# Patient Record
Sex: Female | Born: 1955 | Hispanic: No | Marital: Married | Smoking: Former smoker
Health system: Southern US, Community
[De-identification: ages and names within clinical notes are randomized; demographics above are authoritative.]

## PROBLEM LIST (undated history)

## (undated) DIAGNOSIS — U071 COVID-19: Secondary | ICD-10-CM

## (undated) DIAGNOSIS — E119 Type 2 diabetes mellitus without complications: Secondary | ICD-10-CM

## (undated) DIAGNOSIS — I739 Peripheral vascular disease, unspecified: Secondary | ICD-10-CM

## (undated) DIAGNOSIS — I10 Essential (primary) hypertension: Secondary | ICD-10-CM

## (undated) DIAGNOSIS — F419 Anxiety disorder, unspecified: Secondary | ICD-10-CM

## (undated) HISTORY — DX: COVID-19: U07.1

## (undated) HISTORY — PX: CHOLECYSTECTOMY: SHX55

---

## 2008-12-15 ENCOUNTER — Encounter: Payer: Self-pay | Admitting: Gastroenterology

## 2009-01-05 ENCOUNTER — Encounter: Payer: Self-pay | Admitting: Gastroenterology

## 2009-01-10 ENCOUNTER — Encounter: Payer: Self-pay | Admitting: Gastroenterology

## 2009-01-17 ENCOUNTER — Encounter: Payer: Self-pay | Admitting: Gastroenterology

## 2009-01-26 ENCOUNTER — Encounter: Payer: Self-pay | Admitting: Gastroenterology

## 2009-02-02 ENCOUNTER — Encounter: Payer: Self-pay | Admitting: Gastroenterology

## 2009-02-03 ENCOUNTER — Encounter: Payer: Self-pay | Admitting: Gastroenterology

## 2009-02-07 ENCOUNTER — Encounter: Payer: Self-pay | Admitting: Gastroenterology

## 2009-02-14 ENCOUNTER — Encounter: Payer: Self-pay | Admitting: Gastroenterology

## 2009-02-15 ENCOUNTER — Encounter: Payer: Self-pay | Admitting: Gastroenterology

## 2009-02-15 DIAGNOSIS — R932 Abnormal findings on diagnostic imaging of liver and biliary tract: Secondary | ICD-10-CM | POA: Insufficient documentation

## 2009-03-09 ENCOUNTER — Ambulatory Visit (HOSPITAL_COMMUNITY): Admission: RE | Admit: 2009-03-09 | Discharge: 2009-03-09 | Payer: Self-pay | Admitting: Gastroenterology

## 2009-03-09 ENCOUNTER — Ambulatory Visit: Payer: Self-pay | Admitting: Gastroenterology

## 2018-07-29 DIAGNOSIS — I10 Essential (primary) hypertension: Secondary | ICD-10-CM

## 2018-07-29 DIAGNOSIS — I739 Peripheral vascular disease, unspecified: Secondary | ICD-10-CM

## 2018-07-29 DIAGNOSIS — I160 Hypertensive urgency: Secondary | ICD-10-CM

## 2018-07-29 DIAGNOSIS — R079 Chest pain, unspecified: Secondary | ICD-10-CM

## 2018-07-29 DIAGNOSIS — E785 Hyperlipidemia, unspecified: Secondary | ICD-10-CM

## 2018-07-29 DIAGNOSIS — E119 Type 2 diabetes mellitus without complications: Secondary | ICD-10-CM

## 2018-07-30 DIAGNOSIS — R079 Chest pain, unspecified: Secondary | ICD-10-CM

## 2021-04-17 ENCOUNTER — Telehealth (HOSPITAL_COMMUNITY): Payer: Self-pay | Admitting: Radiology

## 2021-04-17 ENCOUNTER — Other Ambulatory Visit (HOSPITAL_COMMUNITY): Payer: Self-pay | Admitting: Interventional Radiology

## 2021-04-17 DIAGNOSIS — I739 Peripheral vascular disease, unspecified: Secondary | ICD-10-CM

## 2021-04-17 NOTE — Telephone Encounter (Signed)
Tried to call pt, no answer and no VM. JM

## 2021-04-27 ENCOUNTER — Other Ambulatory Visit: Payer: Self-pay | Admitting: Student

## 2021-04-30 ENCOUNTER — Other Ambulatory Visit (HOSPITAL_COMMUNITY): Payer: Self-pay | Admitting: Interventional Radiology

## 2021-04-30 ENCOUNTER — Encounter (HOSPITAL_COMMUNITY): Payer: Self-pay

## 2021-04-30 ENCOUNTER — Other Ambulatory Visit: Payer: Self-pay

## 2021-04-30 ENCOUNTER — Ambulatory Visit (HOSPITAL_BASED_OUTPATIENT_CLINIC_OR_DEPARTMENT_OTHER)
Admission: RE | Admit: 2021-04-30 | Discharge: 2021-04-30 | Disposition: A | Discharge status: Home / Self Care | Payer: Self-pay | Source: Ambulatory Visit | Attending: Interventional Radiology | Admitting: Interventional Radiology

## 2021-04-30 ENCOUNTER — Ambulatory Visit (HOSPITAL_COMMUNITY)
Admission: RE | Admit: 2021-04-30 | Discharge: 2021-04-30 | Disposition: A | Discharge status: Home / Self Care | Payer: Medicaid Other | Source: Ambulatory Visit | Attending: Interventional Radiology | Admitting: Interventional Radiology

## 2021-04-30 ENCOUNTER — Other Ambulatory Visit (HOSPITAL_COMMUNITY): Payer: Self-pay | Admitting: Physician Assistant

## 2021-04-30 DIAGNOSIS — I70211 Atherosclerosis of native arteries of extremities with intermittent claudication, right leg: Secondary | ICD-10-CM | POA: Insufficient documentation

## 2021-04-30 DIAGNOSIS — I10 Essential (primary) hypertension: Secondary | ICD-10-CM | POA: Insufficient documentation

## 2021-04-30 DIAGNOSIS — Z87891 Personal history of nicotine dependence: Secondary | ICD-10-CM | POA: Insufficient documentation

## 2021-04-30 DIAGNOSIS — E1151 Type 2 diabetes mellitus with diabetic peripheral angiopathy without gangrene: Secondary | ICD-10-CM | POA: Insufficient documentation

## 2021-04-30 DIAGNOSIS — I739 Peripheral vascular disease, unspecified: Secondary | ICD-10-CM

## 2021-04-30 DIAGNOSIS — E785 Hyperlipidemia, unspecified: Secondary | ICD-10-CM | POA: Insufficient documentation

## 2021-04-30 HISTORY — PX: IR ANGIOGRAM EXTREMITY RIGHT: IMG652

## 2021-04-30 HISTORY — PX: IR US GUIDE VASC ACCESS RIGHT: IMG2390

## 2021-04-30 HISTORY — PX: IR ILIAC ART STENT INC PTA EA ADD IPSILAT MOD SED: IMG2308

## 2021-04-30 LAB — BASIC METABOLIC PANEL
Anion gap: 10 (ref 5–15)
BUN: 11 mg/dL (ref 8–23)
CO2: 25 mmol/L (ref 22–32)
Calcium: 10.2 mg/dL (ref 8.9–10.3)
Chloride: 102 mmol/L (ref 98–111)
Creatinine, Ser: 0.49 mg/dL (ref 0.44–1.00)
GFR, Estimated: >60 mL/min (ref >60)
Glucose, Bld: 155 mg/dL — ABNORMAL HIGH (ref 70–99)
Potassium: 5.1 mmol/L (ref 3.5–5.1)
Sodium: 137 mmol/L (ref 135–145)

## 2021-04-30 LAB — CBC
HCT: 38.7 % (ref 36.0–46.0)
Hemoglobin: 12 g/dL (ref 12.0–15.0)
MCH: 27.5 pg (ref 26.0–34.0)
MCHC: 31 g/dL (ref 30.0–36.0)
MCV: 88.6 fL (ref 80.0–100.0)
Platelets: 287 10*3/uL (ref 150–400)
RBC: 4.37 MIL/uL (ref 3.87–5.11)
RDW: 17 % — ABNORMAL HIGH (ref 11.5–15.5)
WBC: 7.9 10*3/uL (ref 4.0–10.5)
nRBC: 0 % (ref 0.0–0.2)

## 2021-04-30 LAB — PROTIME-INR
INR: 1 (ref 0.8–1.2)
Prothrombin Time: 13.1 seconds (ref 11.4–15.2)

## 2021-04-30 LAB — GLUCOSE, CAPILLARY: Glucose-Capillary: 143 mg/dL — ABNORMAL HIGH (ref 70–99)

## 2021-04-30 MED ORDER — ASPIRIN 325 MG PO TABS: 650.0000 mg | ORAL_TABLET | Freq: Once | ORAL | Status: AC

## 2021-04-30 MED ORDER — PROTAMINE SULFATE 10 MG/ML IV SOLN
50.0000 mg | Freq: Once | INTRAVENOUS | Status: AC
  Administered 2021-04-30: 40 mg via INTRAVENOUS
  Filled 2021-04-30: qty 5

## 2021-04-30 MED ORDER — FENTANYL CITRATE (PF) 100 MCG/2ML IJ SOLN
INTRAMUSCULAR | Status: AC
  Filled 2021-04-30: qty 2

## 2021-04-30 MED ORDER — MIDAZOLAM HCL 2 MG/2ML IJ SOLN
INTRAMUSCULAR | Status: AC
  Filled 2021-04-30: qty 2

## 2021-04-30 MED ORDER — CLOPIDOGREL BISULFATE 75 MG PO TABS: 300.0000 mg | ORAL_TABLET | Freq: Once | ORAL | Status: AC

## 2021-04-30 MED ORDER — SODIUM CHLORIDE 0.9 % IV SOLN: INTRAVENOUS | Status: DC

## 2021-04-30 MED ORDER — MIDAZOLAM HCL 2 MG/2ML IJ SOLN
INTRAMUSCULAR | Status: AC | PRN
  Administered 2021-04-30 (×3): 1 mg via INTRAVENOUS
  Administered 2021-04-30 (×2): 0.5 mg via INTRAVENOUS

## 2021-04-30 MED ORDER — CLOPIDOGREL BISULFATE 300 MG PO TABS
ORAL_TABLET | ORAL | Status: AC
  Administered 2021-04-30: 300 mg via ORAL
  Filled 2021-04-30: qty 1

## 2021-04-30 MED ORDER — LIDOCAINE HCL (PF) 1 % IJ SOLN
INTRAMUSCULAR | Status: AC
  Filled 2021-04-30: qty 30

## 2021-04-30 MED ORDER — HEPARIN SODIUM (PORCINE) 1000 UNIT/ML IJ SOLN
INTRAMUSCULAR | Status: AC | PRN
  Administered 2021-04-30: 2000 [IU] via INTRAVENOUS
  Administered 2021-04-30: 6000 [IU] via INTRAVENOUS

## 2021-04-30 MED ORDER — FENTANYL CITRATE (PF) 100 MCG/2ML IJ SOLN
INTRAMUSCULAR | Status: AC | PRN
  Administered 2021-04-30: 25 ug via INTRAVENOUS
  Administered 2021-04-30 (×2): 50 ug via INTRAVENOUS
  Administered 2021-04-30: 25 ug via INTRAVENOUS

## 2021-04-30 MED ORDER — IODIXANOL 320 MG/ML IV SOLN
100.0000 mL | Freq: Once | INTRAVENOUS | Status: AC | PRN
  Administered 2021-04-30: 25 mL via INTRA_ARTERIAL

## 2021-04-30 MED ORDER — CLOPIDOGREL BISULFATE 75 MG PO TABS: 75.0000 mg | ORAL_TABLET | Freq: Every day | ORAL | 2 refills | Status: AC

## 2021-04-30 MED ORDER — HEPARIN SODIUM (PORCINE) 1000 UNIT/ML IJ SOLN
INTRAMUSCULAR | Status: AC
  Filled 2021-04-30: qty 1

## 2021-04-30 MED ORDER — ASPIRIN 325 MG PO TABS
ORAL_TABLET | ORAL | Status: AC
  Administered 2021-04-30: 650 mg via ORAL
  Filled 2021-04-30: qty 2

## 2021-04-30 MED ORDER — IODIXANOL 320 MG/ML IV SOLN
100.0000 mL | Freq: Once | INTRAVENOUS | Status: AC | PRN
  Administered 2021-04-30: 30 mL via INTRA_ARTERIAL

## 2021-04-30 MED ORDER — LIDOCAINE HCL (PF) 1 % IJ SOLN
INTRAMUSCULAR | Status: AC | PRN
  Administered 2021-04-30: 10 mL

## 2021-04-30 NOTE — Procedures (Signed)
Interventional Radiology Procedure Note  Procedure:   US guided access right CFA.  US guided access right dorsalis pedis Right lower extremity angiogram.  Pressure measurement  Balloon mounted stenting of right CIA origin stenosis.  Failed retrograde attempt of SFA occlusion from DP Angioseal closure of the right CFA.  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  Pressures:   Aorta: 162/72 (106) Initial CFA 118/69 (88) Final CFA 158/64 (93) .  Complications: None  Recommendations:  - right hip straight x 4 hours - advance diet - dual anti-platelets x 90 days, then only 81mg  ASA after 90 days - Do not submerge for 7 days - Follow up with Dr. in ~4 weeks in clinic  Signed,  Loreta Ave. Yvone Neu, DO

## 2021-04-30 NOTE — Progress Notes (Signed)
Right lower extremity arterial duplex completed. Refer to "CV Proc" under chart review to view preliminary results.  Attempted to call preliminary results to Dr. Loreta Ave at [phone].  04/30/2021 3:16 PM Eula Fried., MHA, RVT, RDCS, RDMS

## 2021-04-30 NOTE — Progress Notes (Signed)
Transported patient back to Short Stay P17 after procedure. Patient's pulses unable to obtain at right posterior tibia and right pedal pulse. Both pulses were doppler obtained prior to procedure. Dr. Wagner aware and plan is to warm leg and re-evaluate at 1315PM. Bedside handoff and update provided to Molly N. RN and Charge RN Page.   

## 2021-04-30 NOTE — H&P (Addendum)
Chief Complaint: Patient was seen in consultation today for peripheral vascular disease  Referring Physician:  Marlena Clipper FNP-C  Supervising Physician: Corrie Mckusick  Patient Status: [facility] - Out-pt  History of Present Illness: Ashley [name] is a 65 y.o. female with history of HLD, HTN, DM, former smoker presents with RLE pain with ambulation due to claudication of the right lower extremity. Symptoms have been intermittently present for months to years but worse in the past 2-3 months. She is known to IR from recent consultation with Dr. Earleen Newport 04/02/21 at which time non-invasive lower extremity exam demonstrates moderate severity resting ABI on the right, which would likely decrease after exercise exam. 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  Discussion was held regarding intervention for endovascular intervention and she elects to proceed.  Ashley [name] presents to [facility] today in her usual state of health.  She reports ongoing pain and cramping in her RLE.  Calf is tender to touch.  No changes in her symptoms since consultation with Dr. Earleen Newport in May.  No swelling, erythema, or new wounds.    Dr. Earleen Newport EDIT: Patient was referred to our service at [facility].  Consult performed 04/02/2021.   Note from that visit: CHIEF COMPLAINT: Painful walking  HISTORY OF PRESENT ILLNESS: Ashley [name] is a 65 year old female presenting as a scheduled consultation to vascular and Interventional Radiology clinic today, kindly referred by her primary care physician, Marlena Clipper FNP-C, for evaluation difficulty walking and known vascular risk factors.  Ashley [name] tells me that her symptoms of pain in the right lower extremity have been worsening now for the last 2-3 months, though have have been episodically present for at least many months to years. She tells me that the pain is mostly in the calf and behind the knee  and is present at short distance of walking, particularly uphill.  The symptoms have significantly altered her ability to perform her daily activities, including gross hree shock upping and cleaning the house.  I cannot elicit any resting pain or night pain, though she says she has occasional cramps in both legs.  She denies any symptoms on the left.  She has never had a wound.  She denies any prior history of heart attack stroke. She denies resting chest pain or chest pain with activity.  Cardiovascular risk factors include diabetes 2, hypertension, hyperlipidemia, history of smoking having quit 5 years ago though 1 pack per day since her teenage years.   Past Surgical history: Gallbladder/cholecystectomy, left oophorectomy  Medications: She reports hypertensive medication, baby aspirin daily, cholesterol medication, and medication for diabetes though does not recall the name  Allergies: No known drug allergies. She has an intolerance to codeine  Family history: She reports multiple family members with lower extremity arterial problems as well as heart attack  Social history: She is a long-time former smoker having quit 5 years ago. One pack per day since her teenage years. She remains very active at her home cleaning and cooking and doing the shopping for the household.  CORRELATIVE IMAGING: Noninvasive study performed 03/26/2021  Right ABI: 0.52  Left ABI: 0.50  Ashley [name] is a 65 year old female presenting with short distance lifestyle limiting claudication of the right extremity, compatible with Rutherford 3 class symptoms.  Non-invasive lower extremity exam demonstrates moderate severity resting ABI on the right, which would likely decrease after exercise exam. 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  I had a lengthy discussion with Ashley [name] regarding anatomy, pathology/pathophysiology,  natural history, and prognosis of PAD/CLI. Informed consent regarding treatment strategies was performed which would possibly include medical management, walking program, surgical strategy, and/or endovascular options, with risk/benefit discussion. The indications for treatment supported by updated guidelines (1, 2) were discussed.  Her main concerns is the fact that she is "miserable" with the symptoms and does not wish to live with the symptoms any further given her limited abilities with claudication.  After our discussion, she has elected to proceed with endovascular options. Regarding endovascular options, specific risks discussed include: bleeding, infection, contrast reaction, renal injury/nephropathy, arterial injury/dissection, need for additional procedure/surgery, worsening symptoms/tissue including limb loss, cardiopulmonary collapse, death.  Regarding medical management, maximal medical therapy for reduction of risk factors is indicated as recommended by updated AHA guidelines. (1.) This includes anti-platelet medication, tight blood glucose control to a HbA1c < 7, tight blood pressure control, maximum-dose HMG-CoA reductase inhibitor, and smoking cessation. She has already been successful with smoking cessation, on which I congratulated her.  Annual flu vaccination is also recommended, with Class 1 recommendation1.    History reviewed. No pertinent past medical history.  Allergies: Codeine  Medications: Prior to Admission medications   Medication Sig Start Date End Date Taking? Authorizing Provider  acetaminophen (TYLENOL) 500 MG tablet Take 1,000 mg by mouth every 6 (six) hours as needed for moderate pain or headache.   Yes [provider]  ALPRAZolam (XANAX) 0.5 MG tablet Take 0.25-0.5 mg by mouth daily. 03/26/21  Yes [provider]  aspirin EC 81 MG tablet Take 81 mg by mouth 2 (two) times daily. Swallow whole.   Yes [provider]   atorvastatin (LIPITOR) 80 MG tablet Take 80 mg by mouth daily.   Yes [provider]  cyclobenzaprine (FLEXERIL) 5 MG tablet Take 5 mg by mouth at bedtime. 01/25/21  Yes [provider]  glipiZIDE (GLUCOTROL) 5 MG tablet Take 10 mg by mouth in the morning and at bedtime. 01/25/21  Yes [provider]  HUMULIN N 100 UNIT/ML injection Inject 60 Units into the skin daily. 01/25/21  Yes [provider]  hydrochlorothiazide (HYDRODIURIL) 25 MG tablet Take 12.5 mg by mouth daily. 01/25/21  Yes [provider]  losartan (COZAAR) 100 MG tablet Take 100 mg by mouth daily. 01/25/21  Yes [provider]  metFORMIN (GLUCOPHAGE) 1000 MG tablet Take 1,000-1,500 mg by mouth See admin instructions. Take 1500 mg in the morning and 1000 mg at night 01/25/21  Yes [provider]  metoprolol tartrate (LOPRESSOR) 25 MG tablet Take 37.5 mg by mouth 2 (two) times daily. 01/25/21  Yes [provider]  pantoprazole (PROTONIX) 40 MG tablet Take 30 mg by mouth daily. 04/10/21  Yes [provider]  Pediatric Multivitamins-Iron (FLINTSTONES COMPLETE PO) Take 2 tablets by mouth daily.   Yes [provider]  potassium chloride (MICRO-K) 10 MEQ CR capsule Take 10 mEq by mouth daily. 01/25/21  Yes [provider]  Tetrahydrozoline HCl (VISINE OP) Place 1 drop into both eyes daily as needed (irritation).   Yes [provider]  traMADol (ULTRAM) 50 MG tablet Take 50 mg by mouth 3 (three) times daily as needed for pain. 03/26/21  Yes [provider]  TRULICITY 1.5 CL/2.7NT SOPN Inject 1.5 mg into the skin every Wednesday. 02/22/21  Yes [provider]  acyclovir (ZOVIRAX) 400 MG tablet Take 400 mg  by mouth daily as needed (fever blisters).    [provider]     History reviewed. No pertinent family history.  Social History   Socioeconomic History  . Marital status: Married    Spouse name: Not on file  . Number  of children: Not on file  . Years of education: Not on file  . Highest education level: Not on file  Occupational History  . Not on file  Tobacco Use  . Smoking status: Not on file  . Smokeless tobacco: Not on file  Substance and Sexual Activity  . Alcohol use: Not on file  . Drug use: Not on file  . Sexual activity: Not on file  Other Topics Concern  . Not on file  Social History Narrative  . Not on file   Social Determinants of Health   Financial Resource Strain: Not on file  Food Insecurity: Not on file  Transportation Needs: Not on file  Physical Activity: Not on file  Stress: Not on file  Social Connections: Not on file     Review of Systems: A 12 point ROS discussed and pertinent positives are indicated in the HPI above.  All other systems are negative.  Review of Systems  Constitutional: Negative for fatigue and fever.  Respiratory: Negative for cough and shortness of breath.   Cardiovascular: Negative for chest pain.  Gastrointestinal: Negative for abdominal pain, diarrhea, nausea and vomiting.  Genitourinary: Negative for dysuria.  Musculoskeletal: Positive for myalgias (calf pain). Negative for back pain.       Aching and cramping in RLE  Skin: Negative for color change, rash and wound.  Psychiatric/Behavioral: Negative for behavioral problems and confusion.    Vital Signs: BP (!) 154/76   Pulse 67   Temp 97.7 F (36.5 C) (Oral)   Ht _0  (1.626 m)   Wt 175 lb (79.4 kg)   SpO2 98%   BMI 30.04 kg/m   Physical Exam Vitals and nursing note reviewed.  Constitutional:      General: She is not in acute distress.    Appearance: Normal appearance. She is not ill-appearing.  HENT:     Mouth/Throat:     Mouth: Mucous membranes are moist.     Pharynx: Oropharynx is clear.  Cardiovascular:     Rate and Rhythm: Normal rate and regular rhythm.  Pulmonary:     Effort: Pulmonary effort is normal.     Breath sounds: Normal breath sounds.  Abdominal:      General: Abdomen is flat.     Palpations: Abdomen is soft.  Musculoskeletal:        General: Tenderness (tenderness to palpation in the right calf today, no edema, no erythema) present. No swelling, deformity or signs of injury.     Right lower leg: No edema.     Left lower leg: No edema.  Skin:    General: Skin is warm and dry.  Neurological:     General: No focal deficit present.     Mental Status: She is alert and oriented to person, place, and time. Mental status is at baseline.  Psychiatric:        Mood and Affect: Mood normal.        Behavior: Behavior normal.        Thought Content: Thought content normal.        Judgment: Judgment normal.      MD Evaluation Airway: WNL Heart: WNL Abdomen: WNL Chest/ Lungs: WNL ASA  Classification: 3 Mallampati/Airway  Score: Two   Imaging: No results found.  Labs:  CBC: Recent Labs    04/30/21 0714  WBC 7.9  HGB 12.0  HCT 38.7  PLT 287    COAGS: Recent Labs    04/30/21 0714  INR 1.0    BMP: Recent Labs    04/30/21 0714  NA 137  K 5.1  CL 102  CO2 25  GLUCOSE 155*  BUN 11  CALCIUM 10.2  CREATININE 0.49  GFRNONAA >60    LIVER FUNCTION TESTS: No results for input(s): BILITOT, AST, ALT, ALKPHOS, PROT, ALBUMIN in the last 8760 hours.  TUMOR MARKERS: No results for input(s): AFPTM, CEA, CA199, CHROMGRNA in the last 8760 hours.  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  IR consulted for angiogram with possible intervention at the request of Marlena Clipper, FNP-C. Case reviewed by Dr. Earleen Newport who has met with the patient in consultation, discussed the procedure at length, and approves patient for procedure.  Patient presents today in their usual state of health.  She has been NPO and is not currently on blood thinners. She did take 81 mg  aspiring this AM.  SCr 0.49  Risks and benefits were discussed with the patient including, but not limited to bleeding, infection, vascular injury or contrast induced renal failure.  This interventional procedure involves the use of X-rays and because of the nature of the planned procedure, it is possible that we will have prolonged use of X-ray fluoroscopy.  Potential radiation risks to you include (but are not limited to) the following: - A slightly elevated risk for cancer  several years later in life. This risk is typically less than 0.5% percent. This risk is low in comparison to the normal incidence of human cancer, which is 33% for women and 50% for men according to the [employer]. - Radiation induced injury can include skin redness, resembling a rash, tissue breakdown / ulcers and hair loss (which can be temporary or permanent).   The likelihood of either of these occurring depends on the difficulty of the procedure and whether you are sensitive to radiation due to previous procedures, disease, or genetic conditions.   IF your procedure requires a prolonged use of radiation, you will be notified and given written instructions for further action.  It is your responsibility to monitor the irradiated area for the 2 weeks following the procedure and to notify your physician if you are concerned that you have suffered a radiation induced injury.    All of the patient's questions were answered, patient is agreeable to proceed.  Consent signed and in chart.  Thank you for this interesting consult.  I greatly enjoyed meeting Ashley [name] and look forward to participating in their care.  A copy of this report was sent to the requesting provider on this date.  Electronically Signed: Docia Barrier, PA 04/30/2021, 8:45 AM   I spent a total of    25 Minutes in face to face in clinical consultation, greater than 50% of which was counseling/coordinating care for RLE  claudication, peripheral vascular disease.

## 2021-04-30 NOTE — Discharge Instructions (Signed)
Femoral Site Care  This sheet gives you information about how to care for yourself after your procedure. Your health care provider may also give you more specific instructions. If you have problems or questions, contact your health care provider. What can I expect after the procedure? After the procedure, it is common to have:  Bruising that usually fades within 1-2 weeks.  Tenderness at the site. Follow these instructions at home: Wound care  Follow instructions from your health care provider about how to take care of your insertion site. Make sure you: ? Wash your hands with soap and water before you change your bandage (dressing). If soap and water are not available, use hand sanitizer. ? Change your dressing as told by your health care provider. ? Leave stitches (sutures), skin glue, or adhesive strips in place. These skin closures may need to stay in place for 2 weeks or longer. If adhesive strip edges start to loosen and curl up, you may trim the loose edges. Do not remove adhesive strips completely unless your health care provider tells you to do that.  Do not take baths, swim, or use a hot tub for one week  You may shower 24-48 hours after the procedure or as told by your health care provider. ? Gently wash the site with plain soap and water. ? Pat the area dry with a clean towel. ? Do not rub the site. This may cause bleeding.  Do not apply powder or lotion to the site. Keep the site clean and dry.  Check your femoral site every day for signs of infection. Check for: ? Redness, swelling, or pain. ? Fluid or blood. ? Warmth. ? Pus or a bad smell. Activity  For the first 2-3 days after your procedure, or as long as directed: ? Avoid climbing stairs as much as possible. ? Do not squat.  Do not lift anything that is heavier than 10 lb (4.5 kg), or the limit that you are told, until your health care provider says that it is safe.  Rest as directed. ? Avoid sitting for a  long time without moving. Get up to take short walks every 1-2 hours.  Do not drive for 24 hours if you were given a medicine to help you relax (sedative). General instructions  Take over-the-counter and prescription medicines only as told by your health care provider.  Keep all follow-up visits as told by your health care provider. This is important. Contact a health care provider if you have:  A fever or chills.  You have redness, swelling, or pain around your insertion site. Get help right away if:  The catheter insertion area swells very fast.  You pass out.  You suddenly start to sweat or your skin gets clammy.  The catheter insertion area is bleeding, and the bleeding does not stop when you hold steady pressure on the area.  The area near or just beyond the catheter insertion site becomes pale, cool, tingly, or numb. These symptoms may represent a serious problem that is an emergency. Do not wait to see if the symptoms will go away. Get medical help right away. Call your local emergency services (911 in the [place] Do not drive yourself to the hospital. Summary  After the procedure, it is common to have bruising that usually fades within 1-2 weeks.  Check your femoral site every day for signs of infection.  Do not lift anything that is heavier than 10 lb (4.5 kg), or the limit that  you are told, until your health care provider says that it is safe. This information is not intended to replace advice given to you by your health care provider. Make sure you discuss any questions you have with your health care provider. Document Revised: 07/14/2020 Document Reviewed: 07/14/2020 Elsevier Patient Education  2021 [employer].

## 2021-04-30 NOTE — Sedation Documentation (Signed)
Transported patient back to Short Stay P17 after procedure. Patient's pulses unable to obtain at right posterior tibia and right pedal pulse. Both pulses were doppler obtained prior to procedure. Dr. Loreta Ave aware and plan is to warm leg and re-evaluate at 1315PM. Bedside handoff and update provided to Christianne Dolin RN and Charge RN Page.

## 2021-04-30 NOTE — Progress Notes (Addendum)
Interventional Radiology Progress Note  65 yo female with PAD, right lower extremity rest pain.    Complex disease, with TASC D aorto-iliac disease.  Left CIA, EIA, and hypogastric artery CTO, with right CIA high grade stenosis, moderate right CFA disease, and long segment right SFA CTO.   Right CIA treated today from RCFA access, with 79mm x 71mm gore VBX.   Resolution of pressure gradient, from gradient of 44 (Aorta: 162/72, Initial CFA 118/69), to final gradient of 4 (CFA 158/64).   Failed attempt of crossing the right SFA CTO from retrograde right pedal approach.    Duplex performed post-op today. Images reviewed.  Duplex demonstrates patent right EIA, CFA, Popliteal artery, and tibial arteries.  Monophasic waveforms noted.  Some artifact at the CFA.   Overall, tibial waveform improved from preop doppler waveform.  Preop: 03/26/2021   Patient denies any pain.  Motor sensory intact, with extremity warm and perfused.  Symmetric to the left.   Plan for DC now with continued max medical therapy and dual anti-platelet therapy.    Office visit in ~4 weeks with Dr. Loreta Ave in clinic.   Signed,  Yvone Neu. Loreta Ave, DO

## 2021-06-12 ENCOUNTER — Ambulatory Visit: Payer: Medicaid Other | Admitting: Vascular Surgery

## 2021-06-12 ENCOUNTER — Other Ambulatory Visit: Payer: Self-pay

## 2021-06-12 ENCOUNTER — Ambulatory Visit (INDEPENDENT_AMBULATORY_CARE_PROVIDER_SITE_OTHER): Payer: Self-pay | Admitting: Vascular Surgery

## 2021-06-12 ENCOUNTER — Encounter: Payer: Self-pay | Admitting: Vascular Surgery

## 2021-06-12 VITALS — BP 172/92 | HR 84 | Temp 98.3°F | Resp 18 | Ht 64.0 in | Wt 177.7 lb

## 2021-06-12 DIAGNOSIS — I70221 Atherosclerosis of native arteries of extremities with rest pain, right leg: Secondary | ICD-10-CM

## 2021-06-12 NOTE — Progress Notes (Signed)
[facility] [place]  ASSESSMENT / PLAN: 65 y.o. female with ischemic rest pain of the right lower extremity.  She underwent treatment of right common iliac artery stenosis with Dr. Loreta Ave 04/30/21.  She unfortunately still has rest pain.  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  We will check a CTA to plan reconstruction.  I suspect treating her common femoral disease will be sufficient to treat her rest pain.  After CTA will call the patient to schedule an operating room time.  CHIEF COMPLAINT: Right leg pain  HISTORY OF PRESENT ILLNESS: Ashley [name] is a 65 y.o. female referred to clinic by Dr. Loreta Ave for evaluation of right lower extremity ischemic rest pain.  Patient reports she has extreme pain with ambulation.  Describes constant pain in her foot typical of ischemic rest pain.  She has no wounds about her foot stepped for the dorsalis pedis access site from Dr. Kenna Gilbert angiogram.  This appears to be healing.  Past medical history: DM2 Hypertension GERD Peripheral arterial disease  Past Surgical History:  Procedure Laterality Date   IR ANGIOGRAM EXTREMITY RIGHT  04/30/2021   IR ILIAC ART STENT INC PTA EA ADD IPSILAT MOD SED  04/30/2021   IR US GUIDE VASC ACCESS RIGHT  04/30/2021   IR US GUIDE VASC ACCESS RIGHT  04/30/2021    History reviewed. No pertinent family history.  Social History   Socioeconomic History   Marital status: Married    Spouse name: Not on file   Number of children: Not on file   Years of education: Not on file   Highest education level: Not on file  Occupational History   Not on file  Tobacco Use   Smoking status: Not on file   Smokeless tobacco: Not on file  Substance and Sexual Activity   Alcohol use: Not on file   Drug use: Not on file   Sexual activity: Not on file  Other Topics Concern   Not on file  Social History Narrative   Not on file   Social Determinants of Health    Financial Resource Strain: Not on file  Food Insecurity: Not on file  Transportation Needs: Not on file  Physical Activity: Not on file  Stress: Not on file  Social Connections: Not on file  Intimate Partner Violence: Not on file    Allergies  Allergen Reactions   Codeine     Upset stomach    Current Outpatient Medications  Medication Sig Dispense Refill   acetaminophen (TYLENOL) 500 MG tablet Take 1,000 mg by mouth every 6 (six) hours as needed for moderate pain or headache.     acyclovir (ZOVIRAX) 400 MG tablet Take 400 mg by mouth daily as needed (fever blisters).     ALPRAZolam (XANAX) 0.5 MG tablet Take 0.25-0.5 mg by mouth daily.     aspirin 325 MG tablet Take 325 mg by mouth daily.     atorvastatin (LIPITOR) 80 MG tablet Take 80 mg by mouth daily.     clopidogrel (PLAVIX) 75 MG tablet Take 1 tablet (75 mg total) by mouth daily. 30 tablet 2   cyclobenzaprine (FLEXERIL) 5 MG tablet Take 5 mg by mouth at bedtime.     glipiZIDE (GLUCOTROL) 5 MG tablet Take 10 mg by mouth in the morning and at bedtime.     HUMULIN N 100 UNIT/ML injection Inject 60 Units into the skin daily.     hydrochlorothiazide (HYDRODIURIL) 25  MG tablet Take 12.5 mg by mouth daily.     losartan (COZAAR) 100 MG tablet Take 100 mg by mouth daily.     metFORMIN (GLUCOPHAGE) 1000 MG tablet Take 1,000-1,500 mg by mouth See admin instructions. Take 1500 mg in the morning and 1000 mg at night     metoprolol tartrate (LOPRESSOR) 25 MG tablet Take 37.5 mg by mouth 2 (two) times daily.     pantoprazole (PROTONIX) 40 MG tablet Take 30 mg by mouth daily.     Pediatric Multivitamins-Iron (FLINTSTONES COMPLETE PO) Take 2 tablets by mouth daily.     potassium chloride (MICRO-K) 10 MEQ CR capsule Take 10 mEq by mouth daily.     Tetrahydrozoline HCl (VISINE OP) Place 1 drop into both eyes daily as needed (irritation).     traMADol (ULTRAM) 50 MG tablet Take 50 mg by mouth 3 (three) times daily as needed for pain.      TRULICITY 1.5 MG/0.5ML SOPN Inject 1.5 mg into the skin every Wednesday.     aspirin EC 81 MG tablet Take 81 mg by mouth 2 (two) times daily. Swallow whole. (Patient not taking: Reported on 06/12/2021)     No current facility-administered medications for this visit.    REVIEW OF SYSTEMS:  [X]  denotes positive finding, [ ]  denotes negative finding Cardiac  Comments:  Chest pain or chest pressure:    Shortness of breath upon exertion:    Short of breath when lying flat:    Irregular heart rhythm:        Vascular    Pain in calf, thigh, or hip brought on by ambulation:    Pain in feet at night that wakes you up from your sleep:     Blood clot in your veins:    Leg swelling:         Pulmonary    Oxygen at home:    Productive cough:     Wheezing:         Neurologic    Sudden weakness in arms or legs:     Sudden numbness in arms or legs:     Sudden onset of difficulty speaking or slurred speech:    Temporary loss of vision in one eye:     Problems with dizziness:         Gastrointestinal    Blood in stool:     Vomited blood:         Genitourinary    Burning when urinating:     Blood in urine:        Psychiatric    Major depression:         Hematologic    Bleeding problems:    Problems with blood clotting too easily:        Skin    Rashes or ulcers:        Constitutional    Fever or chills:      PHYSICAL EXAM Vitals:   06/12/21 1441  BP: (!) 172/92  Pulse: 84  Resp: 18  Temp: 98.3 F (36.8 C)  TempSrc: Temporal  SpO2: 96%  Weight: 177 lb 11.2 oz (80.6 kg)  Height: 5\' 4"  (1.626 m)    Constitutional: well appearing. no distress. Appears well nourished.  Neurologic: CN intact. no focal findings. no sensory loss. Psychiatric:  Mood and affect symmetric and appropriate. Eyes:  No icterus. No conjunctival pallor. Ears, nose, throat:  mucous membranes moist. Midline trachea.  Cardiac: regular rate and rhythm.  Respiratory:  unlabored. Abdominal:  soft,  non-tender, non-distended.  Peripheral vascular: No palpable femoral pulses  No palpable pedal pulses Extremity: no edema. no cyanosis. no pallor.  Skin: no gangrene. no ulceration. DP access site appears to be healing. Lymphatic: No Stemmer's sign. No palpable lymphadenopathy.  PERTINENT LABORATORY AND RADIOLOGIC DATA  Most recent CBC CBC Latest Ref Rng & Units 04/30/2021  WBC 4.0 - 10.5 K/uL 7.9  Hemoglobin 12.0 - 15.0 g/dL 56.8  Hematocrit 12.7 - 46.0 % 38.7  Platelets 150 - 400 K/uL 287     Most recent CMP CMP Latest Ref Rng & Units 04/30/2021  Glucose 70 - 99 mg/dL 517(G)  BUN 8 - 23 mg/dL 11  Creatinine 0.17 - 4.94 mg/dL 4.96  Sodium 759 - 163 mmol/L 137  Potassium 3.5 - 5.1 mmol/L 5.1  Chloride 98 - 111 mmol/L 102  CO2 22 - 32 mmol/L 25  Calcium 8.9 - 10.3 mg/dL 84.6   Dr. Kenna Gilbert angiogram from 04/30/2021 personally reviewed Common femoral plaque is difficult to visualize because of sheath position.  Good technical result from common iliac artery stenting.  Superficial femoral artery appears occluded and reconstitutes about the knee.  Retrograde attempt at revascularization not successful.  Ashley Brunt. [name], MD [facility] Phone Number: [phone] 06/12/2021 4:41 PM

## 2021-06-13 ENCOUNTER — Other Ambulatory Visit: Payer: Self-pay

## 2021-06-13 DIAGNOSIS — I70221 Atherosclerosis of native arteries of extremities with rest pain, right leg: Secondary | ICD-10-CM

## 2021-06-14 ENCOUNTER — Other Ambulatory Visit: Payer: Self-pay

## 2021-06-14 DIAGNOSIS — I70221 Atherosclerosis of native arteries of extremities with rest pain, right leg: Secondary | ICD-10-CM

## 2021-07-11 ENCOUNTER — Other Ambulatory Visit: Payer: Self-pay

## 2021-07-11 ENCOUNTER — Telehealth: Payer: Self-pay

## 2021-07-11 ENCOUNTER — Other Ambulatory Visit: Payer: Self-pay | Admitting: Physician Assistant

## 2021-07-11 NOTE — Progress Notes (Signed)
ERROR

## 2021-07-11 NOTE — Telephone Encounter (Signed)
Pt scheduled for right femoral endarterectomy on 07/19/21 requesting pain medication for right leg rest pain at night. Reports no relief with Tylenol or Tramadol. Discussed with Narda Amber, PA who advised patient won't be able to get good pain control after surgery if additional pain medication is prescribed now. Informed pt of this information and encouraged to continue take Tylenol and Tramadol as directed for pain control. Pt verbalized understanding.

## 2021-07-16 NOTE — Progress Notes (Signed)
Surgical Instructions    Your procedure is scheduled on Thursday, August 25th, 2022.   Report to [facility] Main Entrance "A" at 09:00 A.M., then check in with the Admitting office.  Call this number if you have problems the morning of surgery:  [phone]   If you have any questions prior to your surgery date call [phone]: Open Monday-Friday 8am-4pm    Remember:  Do not eat or drink after midnight the night before your surgery    Take these medicines the morning of surgery with A SIP OF WATER:  atorvastatin (LIPITOR)  metoprolol tartrate (LOPRESSOR)  pantoprazole (PROTONIX)  If needed:  acetaminophen (TYLENOL) acyclovir (ZOVIRAX)  ALPRAZolam (XANAX)  Tetrahydrozoline HCl (VISINE OP) traMADol (ULTRAM)  Follow your surgeon's instructions on when to stop Aspirin and Plavix.  If no instructions were given by your surgeon then you will need to call the office to get those instructions.     As of today, STOP taking any Aspirin (unless otherwise instructed by your surgeon) Aleve, Naproxen, Ibuprofen, Motrin, Advil, Goody's, BC's, all herbal medications, fish oil, and all vitamins.  WHAT DO I DO ABOUT MY DIABETES MEDICATION?   Do not take metFORMIN (GLUCOPHAGE) and TRULICITY  the morning of surgery.      The night before surgery and/or the morning of surgery, take 30 units of HUMULIN N insulin.  Do not take glipiZIDE (GLUCOTROL) the night before surgery and the morning of surgery.   HOW TO MANAGE YOUR DIABETES BEFORE AND AFTER SURGERY  Why is it important to control my blood sugar before and after surgery? Improving blood sugar levels before and after surgery helps healing and can limit problems. A way of improving blood sugar control is eating a healthy diet by:  Eating less sugar and carbohydrates  Increasing activity/exercise  Talking with your doctor about reaching your blood sugar goals High blood sugars (greater than 180 mg/dL) can raise your risk of  infections and slow your recovery, so you will need to focus on controlling your diabetes during the weeks before surgery. Make sure that the doctor who takes care of your diabetes knows about your planned surgery including the date and location.  How do I manage my blood sugar before surgery? Check your blood sugar at least 4 times a day, starting 2 days before surgery, to make sure that the level is not too high or low.  Check your blood sugar the morning of your surgery when you wake up and every 2 hours until you get to the Short Stay unit.  If your blood sugar is less than 70 mg/dL, you will need to treat for low blood sugar: Do not take insulin. Treat a low blood sugar (less than 70 mg/dL) with  cup of clear juice (cranberry or apple), 4 glucose tablets, OR glucose gel. Recheck blood sugar in 15 minutes after treatment (to make sure it is greater than 70 mg/dL). If your blood sugar is not greater than 70 mg/dL on recheck, call [phone] for further instructions. Report your blood sugar to the short stay nurse when you get to Short Stay.  If you are admitted to the hospital after surgery: Your blood sugar will be checked by the staff and you will probably be given insulin after surgery (instead of oral diabetes medicines) to make sure you have good blood sugar levels. The goal for blood sugar control after surgery is 80-180 mg/dL.            Do not wear  jewelry or makeup Do not wear lotions, powders, perfumes, or deodorant. Do not shave 48 hours prior to surgery.   Do not bring valuables to the hospital. DO Not wear nail polish, gel polish, artificial nails, or any other type of covering on natural nails including finger and toenails. If patients have artificial nails, gel coating, etc. that need to be removed by a nail salon please have this removed prior to surgery or surgery may need to be canceled/delayed if the surgeon/ anesthesia feels like the patient is unable to be  adequately monitored.             [facility] is not responsible for any belongings or valuables.  Do NOT Smoke (Tobacco/Vaping) or drink Alcohol 24 hours prior to your procedure If you use a CPAP at night, you may bring all equipment for your overnight stay.   Contacts, glasses, dentures or bridgework may not be worn into surgery, please bring cases for these belongings   For patients admitted to the hospital, discharge time will be determined by your treatment team.   Patients discharged the day of surgery will not be allowed to drive home, and someone needs to stay with them for 24 hours.  ONLY 1 SUPPORT PERSON MAY BE PRESENT WHILE YOU ARE IN SURGERY. IF YOU ARE TO BE ADMITTED ONCE YOU ARE IN YOUR ROOM YOU WILL BE ALLOWED TWO (2) VISITORS.  Minor children may have two parents present. Special consideration for safety and communication needs will be reviewed on a case by case basis.  Special instructions:    Oral Hygiene is also important to reduce your risk of infection.  Remember - BRUSH YOUR TEETH THE MORNING OF SURGERY WITH YOUR REGULAR TOOTHPASTE   [facility]- Preparing For Surgery  Before surgery, you can play an important role. Because skin is not sterile, your skin needs to be as free of germs as possible. You can reduce the number of germs on your skin by washing with CHG (chlorahexidine gluconate) Soap before surgery.  CHG is an antiseptic cleaner which kills germs and bonds with the skin to continue killing germs even after washing.     Please do not use if you have an allergy to CHG or antibacterial soaps. If your skin becomes reddened/irritated stop using the CHG.  Do not shave (including legs and underarms) for at least 48 hours prior to first CHG shower. It is OK to shave your face.  Please follow these instructions carefully.     Shower the NIGHT BEFORE SURGERY and the MORNING OF SURGERY with CHG Soap.   If you chose to wash your hair, wash your hair first as usual  with your normal shampoo. After you shampoo, rinse your hair and body thoroughly to remove the shampoo.  Then [employer] and genitals (private parts) with your normal soap and rinse thoroughly to remove soap.  After that Use CHG Soap as you would any other liquid soap. You can apply CHG directly to the skin and wash gently with a scrungie or a clean washcloth.   Apply the CHG Soap to your body ONLY FROM THE NECK DOWN.  Do not use on open wounds or open sores. Avoid contact with your eyes, ears, mouth and genitals (private parts). Wash Face and genitals (private parts)  with your normal soap.   Wash thoroughly, paying special attention to the area where your surgery will be performed.  Thoroughly rinse your body with warm water from the neck down.  DO NOT shower/wash with your normal soap after using and rinsing off the CHG Soap.  Pat yourself dry with a CLEAN TOWEL.  Wear CLEAN PAJAMAS to bed the night before surgery  Place CLEAN SHEETS on your bed the night before your surgery  DO NOT SLEEP WITH PETS.   Day of Surgery:  Take a shower with CHG soap. Wear Clean/Comfortable clothing the morning of surgery Do not apply any deodorants/lotions.   Remember to brush your teeth WITH YOUR REGULAR TOOTHPASTE.   Please read over the following fact sheets that you were given.

## 2021-07-17 ENCOUNTER — Other Ambulatory Visit: Payer: Self-pay

## 2021-07-17 ENCOUNTER — Encounter (HOSPITAL_COMMUNITY)
Admission: RE | Admit: 2021-07-17 | Discharge: 2021-07-17 | Disposition: A | Discharge status: Home / Self Care | Payer: Medicaid Other | Source: Ambulatory Visit | Attending: Vascular Surgery | Admitting: Vascular Surgery

## 2021-07-17 ENCOUNTER — Encounter (HOSPITAL_COMMUNITY): Payer: Self-pay

## 2021-07-17 DIAGNOSIS — Z01812 Encounter for preprocedural laboratory examination: Secondary | ICD-10-CM | POA: Insufficient documentation

## 2021-07-17 HISTORY — DX: Peripheral vascular disease, unspecified: I73.9

## 2021-07-17 HISTORY — DX: Essential (primary) hypertension: I10

## 2021-07-17 HISTORY — DX: Type 2 diabetes mellitus without complications: E11.9

## 2021-07-17 HISTORY — DX: Anxiety disorder, unspecified: F41.9

## 2021-07-17 LAB — URINALYSIS, ROUTINE W REFLEX MICROSCOPIC
Bilirubin Urine: NEGATIVE
Glucose, UA: NEGATIVE mg/dL
Hgb urine dipstick: NEGATIVE
Ketones, ur: NEGATIVE mg/dL
Leukocytes,Ua: NEGATIVE
Nitrite: NEGATIVE
Protein, ur: NEGATIVE mg/dL
Specific Gravity, Urine: 1.018 (ref 1.005–1.030)
pH: 6 (ref 5.0–8.0)

## 2021-07-17 LAB — COMPREHENSIVE METABOLIC PANEL
ALT: 40 U/L (ref 0–44)
AST: 70 U/L — ABNORMAL HIGH (ref 15–41)
Albumin: 4.2 g/dL (ref 3.5–5.0)
Alkaline Phosphatase: 62 U/L (ref 38–126)
Anion gap: 10 (ref 5–15)
BUN: 8 mg/dL (ref 8–23)
CO2: 27 mmol/L (ref 22–32)
Calcium: 9.4 mg/dL (ref 8.9–10.3)
Chloride: 101 mmol/L (ref 98–111)
Creatinine, Ser: 0.51 mg/dL (ref 0.44–1.00)
GFR, Estimated: >60 mL/min (ref >60)
Glucose, Bld: 147 mg/dL — ABNORMAL HIGH (ref 70–99)
Potassium: 4.2 mmol/L (ref 3.5–5.1)
Sodium: 138 mmol/L (ref 135–145)
Total Bilirubin: 1.2 mg/dL (ref 0.3–1.2)
Total Protein: 7.6 g/dL (ref 6.5–8.1)

## 2021-07-17 LAB — CBC
HCT: 37.7 % (ref 36.0–46.0)
Hemoglobin: 11.5 g/dL — ABNORMAL LOW (ref 12.0–15.0)
MCH: 27.3 pg (ref 26.0–34.0)
MCHC: 30.5 g/dL (ref 30.0–36.0)
MCV: 89.5 fL (ref 80.0–100.0)
Platelets: 298 10*3/uL (ref 150–400)
RBC: 4.21 MIL/uL (ref 3.87–5.11)
RDW: 15.5 % (ref 11.5–15.5)
WBC: 7.8 10*3/uL (ref 4.0–10.5)
nRBC: 0 % (ref 0.0–0.2)

## 2021-07-17 LAB — PROTIME-INR
INR: 1.1 (ref 0.8–1.2)
Prothrombin Time: 14.6 seconds (ref 11.4–15.2)

## 2021-07-17 LAB — TYPE AND SCREEN
ABO/RH(D): O POS
Antibody Screen: NEGATIVE

## 2021-07-17 LAB — SURGICAL PCR SCREEN
MRSA, PCR: NEGATIVE
Staphylococcus aureus: POSITIVE — AB

## 2021-07-17 LAB — GLUCOSE, CAPILLARY: Glucose-Capillary: 129 mg/dL — ABNORMAL HIGH (ref 70–99)

## 2021-07-17 LAB — APTT: aPTT: 30 seconds (ref 24–36)

## 2021-07-17 NOTE — Progress Notes (Signed)
PCP - Wenda Low, FNP - [facility] Practicce Cardiologist - denies  PPM/ICD - denies Device Orders - N/A Rep Notified - N/A  Chest x-ray - N/A EKG - 07/17/2021 Stress Test - denies ECHO - denies Cardiac Cath - denies  Sleep Study - denies CPAP - N/A  Fasting Blood Sugar - 102 - 200 Checks Blood Sugar once a day CBG today - 129 A1C - per patient, A1C was done in MD office in July 2022 (result requested)  Blood Thinner Instructions: MD office was called and RN verbalized that patient should continue to take Plavix and Aspirin with no stop.  Aspirin Instructions: As of today, STOP taking any Aspirin (unless otherwise instructed by your surgeon) Aleve, Naproxen, Ibuprofen, Motrin, Advil, Goody's, BC's, all herbal medications, fish oil, and all vitamins.  ERAS Protcol - No  COVID TEST- no; patient verbalized that she had the test today at the COVID testing site - [facility]  Anesthesia review: no  Patient denies shortness of breath, fever, cough and chest pain at PAT appointment   All instructions explained to the patient, with a verbal understanding of the material. Patient agrees to go over the instructions while at home for a better understanding. Patient also instructed to self quarantine after being tested for COVID-19. The opportunity to ask questions was provided.

## 2021-07-17 NOTE — Progress Notes (Signed)
PCP - Wenda Low, FNP - [facility] Practicce Cardiologist - denies  PPM/ICD - denies Device Orders - N/A Rep Notified - N/A  Chest x-ray - N/A EKG - 07/17/2021 Stress Test - denies ECHO - denies Cardiac Cath - denies  Sleep Study - denies CPAP - N/A  Fasting Blood Sugar - 102 - 200 Checks Blood Sugar once a day CBG today - 129 A1C - per patient, A1C was done in MD office in July 2022 (result requested)  Blood Thinner Instructions: MD office was called and RN verbalized that patient should continue to take Plavix and Aspirin with no stop.  Aspirin Instructions: As of today, STOP taking any Aspirin (unless otherwise instructed by your surgeon) Aleve, Naproxen, Ibuprofen, Motrin, Advil, Goody's, BC's, all herbal medications, fish oil, and all vitamins.  ERAS Protcol - No  COVID TEST- no; patient verbalized that she had the test today at the COVID testing site - [address] [facility]  Anesthesia review: yes  Patient denies shortness of breath, fever, cough and chest pain at PAT appointment   All instructions explained to the patient, with a verbal understanding of the material. Patient agrees to go over the instructions while at home for a better understanding. Patient also instructed to self quarantine after being tested for COVID-19. The opportunity to ask questions was provided.

## 2021-07-17 NOTE — Progress Notes (Signed)
PCP - Wenda Low, FNP - [facility] Practicce Cardiologist - denies  PPM/ICD - denies Device Orders - N/A Rep Notified - N/A  Chest x-ray - N/A EKG - 07/17/2021 Stress Test - denies ECHO - denies Cardiac Cath - denies  Sleep Study - denies CPAP - N/A  Fasting Blood Sugar - 102 - 200 Checks Blood Sugar once a day CBG today - 129 A1C - per patient, A1C was done in MD office in July 2022 (result requested)  Blood Thinner Instructions: Plavix - patient was instructed to call MD and ask if she must stop taking Plavix before surgery.  Aspirin Instructions: Patient was instructed to ask MD if she must stop taking Aspirin before surgery. As of today, STOP taking any Aspirin (unless otherwise instructed by your surgeon) Aleve, Naproxen, Ibuprofen, Motrin, Advil, Goody's, BC's, all herbal medications, fish oil, and all vitamins.  ERAS Protcol - No  COVID TEST- no; patient verbalized that she had the test today at the COVID testing site - [facility]  Anesthesia review: yes  Patient denies shortness of breath, fever, cough and chest pain at PAT appointment   All instructions explained to the patient, with a verbal understanding of the material. Patient agrees to go over the instructions while at home for a better understanding. Patient also instructed to self quarantine after being tested for COVID-19. The opportunity to ask questions was provided.

## 2021-07-19 ENCOUNTER — Inpatient Hospital Stay (HOSPITAL_COMMUNITY)
Admission: RE | Admit: 2021-07-19 | Discharge: 2021-07-21 | DRG: 272 | Disposition: A | Discharge status: Home / Self Care | Payer: Medicaid Other | Attending: Vascular Surgery | Admitting: Vascular Surgery

## 2021-07-19 ENCOUNTER — Inpatient Hospital Stay (HOSPITAL_COMMUNITY): Payer: Medicaid Other | Admitting: Certified Registered Nurse Anesthetist

## 2021-07-19 ENCOUNTER — Encounter (HOSPITAL_COMMUNITY): Payer: Self-pay | Admitting: Vascular Surgery

## 2021-07-19 ENCOUNTER — Encounter (HOSPITAL_COMMUNITY)
Admission: RE | Disposition: A | Discharge status: Home / Self Care | Payer: Self-pay | Source: Home / Self Care | Attending: Vascular Surgery

## 2021-07-19 ENCOUNTER — Other Ambulatory Visit: Payer: Self-pay

## 2021-07-19 DIAGNOSIS — I739 Peripheral vascular disease, unspecified: Secondary | ICD-10-CM

## 2021-07-19 DIAGNOSIS — Z794 Long term (current) use of insulin: Secondary | ICD-10-CM

## 2021-07-19 DIAGNOSIS — E1151 Type 2 diabetes mellitus with diabetic peripheral angiopathy without gangrene: Principal | ICD-10-CM | POA: Diagnosis present

## 2021-07-19 DIAGNOSIS — Z7984 Long term (current) use of oral hypoglycemic drugs: Secondary | ICD-10-CM

## 2021-07-19 DIAGNOSIS — I70221 Atherosclerosis of native arteries of extremities with rest pain, right leg: Secondary | ICD-10-CM

## 2021-07-19 DIAGNOSIS — Z885 Allergy status to narcotic agent status: Secondary | ICD-10-CM

## 2021-07-19 DIAGNOSIS — Z9049 Acquired absence of other specified parts of digestive tract: Secondary | ICD-10-CM

## 2021-07-19 DIAGNOSIS — R202 Paresthesia of skin: Secondary | ICD-10-CM | POA: Diagnosis not present

## 2021-07-19 DIAGNOSIS — R200 Anesthesia of skin: Secondary | ICD-10-CM | POA: Diagnosis not present

## 2021-07-19 DIAGNOSIS — Z87891 Personal history of nicotine dependence: Secondary | ICD-10-CM

## 2021-07-19 DIAGNOSIS — F419 Anxiety disorder, unspecified: Secondary | ICD-10-CM | POA: Diagnosis present

## 2021-07-19 DIAGNOSIS — K219 Gastro-esophageal reflux disease without esophagitis: Secondary | ICD-10-CM | POA: Diagnosis present

## 2021-07-19 DIAGNOSIS — I10 Essential (primary) hypertension: Secondary | ICD-10-CM | POA: Diagnosis present

## 2021-07-19 DIAGNOSIS — E1165 Type 2 diabetes mellitus with hyperglycemia: Secondary | ICD-10-CM | POA: Diagnosis not present

## 2021-07-19 HISTORY — PX: VEIN HARVEST: SHX6363

## 2021-07-19 HISTORY — PX: APPLICATION OF WOUND VAC: SHX5189

## 2021-07-19 HISTORY — PX: ENDARTERECTOMY FEMORAL: SHX5804

## 2021-07-19 LAB — CREATININE, SERUM
Creatinine, Ser: 0.59 mg/dL (ref 0.44–1.00)
GFR, Estimated: >60 mL/min (ref >60)

## 2021-07-19 LAB — GLUCOSE, CAPILLARY
Glucose-Capillary: 205 mg/dL — ABNORMAL HIGH (ref 70–99)
Glucose-Capillary: 106 mg/dL — ABNORMAL HIGH (ref 70–99)
Glucose-Capillary: 136 mg/dL — ABNORMAL HIGH (ref 70–99)
Glucose-Capillary: 195 mg/dL — ABNORMAL HIGH (ref 70–99)
Glucose-Capillary: 269 mg/dL — ABNORMAL HIGH (ref 70–99)
Glucose-Capillary: 366 mg/dL — ABNORMAL HIGH (ref 70–99)

## 2021-07-19 LAB — CBC
HCT: 28.2 % — ABNORMAL LOW (ref 36.0–46.0)
Hemoglobin: 8.7 g/dL — ABNORMAL LOW (ref 12.0–15.0)
MCH: 27.6 pg (ref 26.0–34.0)
MCHC: 30.9 g/dL (ref 30.0–36.0)
MCV: 89.5 fL (ref 80.0–100.0)
Platelets: 218 10*3/uL (ref 150–400)
RBC: 3.15 MIL/uL — ABNORMAL LOW (ref 3.87–5.11)
RDW: 15.4 % (ref 11.5–15.5)
WBC: 7.8 10*3/uL (ref 4.0–10.5)
nRBC: 0 % (ref 0.0–0.2)

## 2021-07-19 LAB — POCT ACTIVATED CLOTTING TIME
Activated Clotting Time: 271 seconds
Activated Clotting Time: 219 seconds

## 2021-07-19 LAB — ABO/RH: ABO/RH(D): O POS

## 2021-07-19 LAB — HEMOGLOBIN A1C
Hgb A1c MFr Bld: 8.4 % — ABNORMAL HIGH (ref 4.8–5.6)
Mean Plasma Glucose: 194.38 mg/dL

## 2021-07-19 SURGERY — ENDARTERECTOMY, FEMORAL
Anesthesia: General | Site: Groin | Laterality: Right

## 2021-07-19 MED ORDER — AMISULPRIDE (ANTIEMETIC) 5 MG/2ML IV SOLN: 10.0000 mg | Freq: Once | INTRAVENOUS | Status: DC | PRN

## 2021-07-19 MED ORDER — HYDROCHLOROTHIAZIDE 25 MG PO TABS
12.5000 mg | ORAL_TABLET | Freq: Every day | ORAL | Status: DC
  Administered 2021-07-20 – 2021-07-21 (×2): 12.5 mg via ORAL
  Filled 2021-07-19 (×2): qty 1

## 2021-07-19 MED ORDER — FENTANYL CITRATE (PF) 250 MCG/5ML IJ SOLN
INTRAMUSCULAR | Status: AC
  Filled 2021-07-19: qty 5

## 2021-07-19 MED ORDER — CYCLOBENZAPRINE HCL 10 MG PO TABS
5.0000 mg | ORAL_TABLET | Freq: Every day | ORAL | Status: DC
  Administered 2021-07-19 – 2021-07-20 (×2): 5 mg via ORAL
  Filled 2021-07-19 (×2): qty 1

## 2021-07-19 MED ORDER — ROCURONIUM BROMIDE 10 MG/ML (PF) SYRINGE
PREFILLED_SYRINGE | INTRAVENOUS | Status: DC | PRN
  Administered 2021-07-19: 30 mg via INTRAVENOUS
  Administered 2021-07-19: 50 mg via INTRAVENOUS

## 2021-07-19 MED ORDER — INSULIN ASPART 100 UNIT/ML IJ SOLN
4.0000 [IU] | Freq: Once | INTRAMUSCULAR | Status: AC
  Administered 2021-07-19: 4 [IU] via SUBCUTANEOUS
  Filled 2021-07-19: qty 1

## 2021-07-19 MED ORDER — METOPROLOL TARTRATE 25 MG PO TABS
37.5000 mg | ORAL_TABLET | Freq: Two times a day (BID) | ORAL | Status: DC
  Administered 2021-07-19 – 2021-07-21 (×4): 37.5 mg via ORAL
  Filled 2021-07-19 (×4): qty 1

## 2021-07-19 MED ORDER — PHENOL 1.4 % MT LIQD: 1.0000 | OROMUCOSAL | Status: DC | PRN

## 2021-07-19 MED ORDER — LABETALOL HCL 5 MG/ML IV SOLN: 10.0000 mg | INTRAVENOUS | Status: DC | PRN

## 2021-07-19 MED ORDER — POTASSIUM CHLORIDE CRYS ER 20 MEQ PO TBCR: 20.0000 meq | EXTENDED_RELEASE_TABLET | Freq: Every day | ORAL | Status: DC | PRN

## 2021-07-19 MED ORDER — PROTAMINE SULFATE 10 MG/ML IV SOLN
INTRAVENOUS | Status: AC
  Filled 2021-07-19: qty 10

## 2021-07-19 MED ORDER — INSULIN ASPART 100 UNIT/ML IJ SOLN
0.0000 [IU] | Freq: Every day | INTRAMUSCULAR | Status: DC
  Administered 2021-07-19: 5 [IU] via SUBCUTANEOUS
  Administered 2021-07-20: 3 [IU] via SUBCUTANEOUS

## 2021-07-19 MED ORDER — FENTANYL CITRATE (PF) 100 MCG/2ML IJ SOLN
INTRAMUSCULAR | Status: AC
  Filled 2021-07-19: qty 2

## 2021-07-19 MED ORDER — CHLORHEXIDINE GLUCONATE CLOTH 2 % EX PADS: 6.0000 | MEDICATED_PAD | Freq: Once | CUTANEOUS | Status: DC

## 2021-07-19 MED ORDER — ATORVASTATIN CALCIUM 80 MG PO TABS
80.0000 mg | ORAL_TABLET | Freq: Every day | ORAL | Status: DC
  Administered 2021-07-20 – 2021-07-21 (×2): 80 mg via ORAL
  Filled 2021-07-19 (×2): qty 1

## 2021-07-19 MED ORDER — DOCUSATE SODIUM 100 MG PO CAPS
100.0000 mg | ORAL_CAPSULE | Freq: Every day | ORAL | Status: DC
  Administered 2021-07-20 – 2021-07-21 (×2): 100 mg via ORAL
  Filled 2021-07-19 (×2): qty 1

## 2021-07-19 MED ORDER — ONDANSETRON HCL 4 MG/2ML IJ SOLN
INTRAMUSCULAR | Status: DC | PRN
  Administered 2021-07-19: 4 mg via INTRAVENOUS

## 2021-07-19 MED ORDER — ACETAMINOPHEN 500 MG PO TABS
1000.0000 mg | ORAL_TABLET | Freq: Once | ORAL | Status: AC
  Administered 2021-07-19: 1000 mg via ORAL
  Filled 2021-07-19: qty 2

## 2021-07-19 MED ORDER — 0.9 % SODIUM CHLORIDE (POUR BTL) OPTIME
TOPICAL | Status: DC | PRN
  Administered 2021-07-19: 2000 mL

## 2021-07-19 MED ORDER — HEPARIN SODIUM (PORCINE) 1000 UNIT/ML IJ SOLN
INTRAMUSCULAR | Status: DC | PRN
  Administered 2021-07-19: 8000 [IU] via INTRAVENOUS

## 2021-07-19 MED ORDER — FENTANYL CITRATE (PF) 250 MCG/5ML IJ SOLN
INTRAMUSCULAR | Status: DC | PRN
  Administered 2021-07-19 (×4): 50 ug via INTRAVENOUS
  Administered 2021-07-19 (×2): 100 ug via INTRAVENOUS

## 2021-07-19 MED ORDER — MIDAZOLAM HCL 5 MG/5ML IJ SOLN
INTRAMUSCULAR | Status: DC | PRN
  Administered 2021-07-19: 2 mg via INTRAVENOUS

## 2021-07-19 MED ORDER — ACETAMINOPHEN 325 MG PO TABS
325.0000 mg | ORAL_TABLET | ORAL | Status: DC | PRN
  Administered 2021-07-21: 650 mg via ORAL
  Filled 2021-07-19: qty 2

## 2021-07-19 MED ORDER — HYDRALAZINE HCL 20 MG/ML IJ SOLN: 5.0000 mg | INTRAMUSCULAR | Status: DC | PRN

## 2021-07-19 MED ORDER — PROTAMINE SULFATE 10 MG/ML IV SOLN
INTRAVENOUS | Status: DC | PRN
  Administered 2021-07-19: 50 mg via INTRAVENOUS

## 2021-07-19 MED ORDER — LABETALOL HCL 5 MG/ML IV SOLN
INTRAVENOUS | Status: AC
  Filled 2021-07-19: qty 4

## 2021-07-19 MED ORDER — HEPARIN 6000 UNIT IRRIGATION SOLUTION
Status: DC | PRN
  Administered 2021-07-19: 1

## 2021-07-19 MED ORDER — LINACLOTIDE 145 MCG PO CAPS
290.0000 ug | ORAL_CAPSULE | Freq: Every day | ORAL | Status: DC
  Administered 2021-07-20 – 2021-07-21 (×2): 290 ug via ORAL
  Filled 2021-07-19 (×3): qty 2

## 2021-07-19 MED ORDER — PHENYLEPHRINE 40 MCG/ML (10ML) SYRINGE FOR IV PUSH (FOR BLOOD PRESSURE SUPPORT)
PREFILLED_SYRINGE | INTRAVENOUS | Status: AC
  Filled 2021-07-19: qty 10

## 2021-07-19 MED ORDER — PHENYLEPHRINE HCL-NACL 20-0.9 MG/250ML-% IV SOLN
INTRAVENOUS | Status: DC | PRN
  Administered 2021-07-19: 25 ug/min via INTRAVENOUS

## 2021-07-19 MED ORDER — INSULIN ASPART 100 UNIT/ML IJ SOLN: 0.0000 [IU] | Freq: Three times a day (TID) | INTRAMUSCULAR | Status: DC

## 2021-07-19 MED ORDER — SODIUM CHLORIDE 0.9 % IV SOLN: 500.0000 mL | Freq: Once | INTRAVENOUS | Status: DC | PRN

## 2021-07-19 MED ORDER — ONDANSETRON HCL 4 MG/2ML IJ SOLN: 4.0000 mg | Freq: Four times a day (QID) | INTRAMUSCULAR | Status: DC | PRN

## 2021-07-19 MED ORDER — CLOPIDOGREL BISULFATE 75 MG PO TABS
75.0000 mg | ORAL_TABLET | Freq: Every day | ORAL | Status: DC
  Administered 2021-07-20 – 2021-07-21 (×2): 75 mg via ORAL
  Filled 2021-07-19 (×3): qty 1

## 2021-07-19 MED ORDER — CEFAZOLIN SODIUM-DEXTROSE 2-4 GM/100ML-% IV SOLN
2.0000 g | Freq: Three times a day (TID) | INTRAVENOUS | Status: AC
Start: 2021-07-19 — End: 2021-07-20
  Administered 2021-07-19 – 2021-07-20 (×2): 2 g via INTRAVENOUS
  Filled 2021-07-19 (×2): qty 100

## 2021-07-19 MED ORDER — MAGNESIUM SULFATE 2 GM/50ML IV SOLN: 2.0000 g | Freq: Every day | INTRAVENOUS | Status: DC | PRN

## 2021-07-19 MED ORDER — LOSARTAN POTASSIUM 50 MG PO TABS
100.0000 mg | ORAL_TABLET | Freq: Every day | ORAL | Status: DC
  Administered 2021-07-20 – 2021-07-21 (×2): 100 mg via ORAL
  Filled 2021-07-19 (×2): qty 2

## 2021-07-19 MED ORDER — METOPROLOL TARTRATE 5 MG/5ML IV SOLN: 2.0000 mg | INTRAVENOUS | Status: DC | PRN

## 2021-07-19 MED ORDER — CEFAZOLIN SODIUM-DEXTROSE 2-4 GM/100ML-% IV SOLN
2.0000 g | INTRAVENOUS | Status: AC
  Administered 2021-07-19: 2 g via INTRAVENOUS
  Filled 2021-07-19: qty 100

## 2021-07-19 MED ORDER — MORPHINE SULFATE (PF) 2 MG/ML IV SOLN
2.0000 mg | INTRAVENOUS | Status: DC | PRN
Start: 2021-07-19 — End: 2021-07-21
  Administered 2021-07-19 – 2021-07-20 (×6): 2 mg via INTRAVENOUS
  Filled 2021-07-19 (×6): qty 1

## 2021-07-19 MED ORDER — CHLORHEXIDINE GLUCONATE 0.12 % MT SOLN
15.0000 mL | Freq: Once | OROMUCOSAL | Status: AC
  Administered 2021-07-19: 15 mL via OROMUCOSAL
  Filled 2021-07-19: qty 15

## 2021-07-19 MED ORDER — LIDOCAINE 2% (20 MG/ML) 5 ML SYRINGE
INTRAMUSCULAR | Status: DC | PRN
  Administered 2021-07-19: 60 mg via INTRAVENOUS

## 2021-07-19 MED ORDER — MIDAZOLAM HCL 2 MG/2ML IJ SOLN
INTRAMUSCULAR | Status: AC
  Filled 2021-07-19: qty 2

## 2021-07-19 MED ORDER — DEXAMETHASONE SODIUM PHOSPHATE 10 MG/ML IJ SOLN
INTRAMUSCULAR | Status: DC | PRN
  Administered 2021-07-19: 4 mg via INTRAVENOUS

## 2021-07-19 MED ORDER — PANTOPRAZOLE SODIUM 40 MG PO TBEC
40.0000 mg | DELAYED_RELEASE_TABLET | Freq: Every day | ORAL | Status: DC
  Administered 2021-07-20 – 2021-07-21 (×2): 40 mg via ORAL
  Filled 2021-07-19 (×2): qty 1

## 2021-07-19 MED ORDER — HEPARIN 6000 UNIT IRRIGATION SOLUTION
Status: AC
  Filled 2021-07-19: qty 500

## 2021-07-19 MED ORDER — ASPIRIN EC 81 MG PO TBEC
81.0000 mg | DELAYED_RELEASE_TABLET | Freq: Every day | ORAL | Status: DC
  Administered 2021-07-20 – 2021-07-21 (×2): 81 mg via ORAL
  Filled 2021-07-19 (×2): qty 1

## 2021-07-19 MED ORDER — FENTANYL CITRATE (PF) 100 MCG/2ML IJ SOLN
25.0000 ug | INTRAMUSCULAR | Status: DC | PRN
  Administered 2021-07-19 (×5): 25 ug via INTRAVENOUS

## 2021-07-19 MED ORDER — SODIUM CHLORIDE 0.9 % IV SOLN: INTRAVENOUS | Status: DC

## 2021-07-19 MED ORDER — EPHEDRINE SULFATE-NACL 50-0.9 MG/10ML-% IV SOSY
PREFILLED_SYRINGE | INTRAVENOUS | Status: DC | PRN
  Administered 2021-07-19: 5 mg via INTRAVENOUS

## 2021-07-19 MED ORDER — POLYETHYLENE GLYCOL 3350 17 G PO PACK: 17.0000 g | PACK | Freq: Every day | ORAL | Status: DC | PRN

## 2021-07-19 MED ORDER — TRAMADOL HCL 50 MG PO TABS
50.0000 mg | ORAL_TABLET | Freq: Four times a day (QID) | ORAL | Status: DC | PRN
Start: 2021-07-19 — End: 2021-07-21
  Administered 2021-07-21: 50 mg via ORAL
  Filled 2021-07-19: qty 1

## 2021-07-19 MED ORDER — HEMOSTATIC AGENTS (NO CHARGE) OPTIME
TOPICAL | Status: DC | PRN
  Administered 2021-07-19: 1 via TOPICAL

## 2021-07-19 MED ORDER — LABETALOL HCL 5 MG/ML IV SOLN
INTRAVENOUS | Status: DC | PRN
  Administered 2021-07-19 (×2): 5 mg via INTRAVENOUS

## 2021-07-19 MED ORDER — SUGAMMADEX SODIUM 200 MG/2ML IV SOLN
INTRAVENOUS | Status: DC | PRN
  Administered 2021-07-19: 200 mg via INTRAVENOUS

## 2021-07-19 MED ORDER — GUAIFENESIN-DM 100-10 MG/5ML PO SYRP: 15.0000 mL | ORAL_SOLUTION | ORAL | Status: DC | PRN

## 2021-07-19 MED ORDER — ALPRAZOLAM 0.5 MG PO TABS: 0.5000 mg | ORAL_TABLET | Freq: Every day | ORAL | Status: DC | PRN

## 2021-07-19 MED ORDER — PROPOFOL 10 MG/ML IV BOLUS
INTRAVENOUS | Status: DC | PRN
  Administered 2021-07-19: 30 mg via INTRAVENOUS
  Administered 2021-07-19: 140 mg via INTRAVENOUS

## 2021-07-19 MED ORDER — ACETAMINOPHEN 650 MG RE SUPP: 325.0000 mg | RECTAL | Status: DC | PRN

## 2021-07-19 MED ORDER — ALUM & MAG HYDROXIDE-SIMETH 200-200-20 MG/5ML PO SUSP: 15.0000 mL | ORAL | Status: DC | PRN

## 2021-07-19 MED ORDER — ORAL CARE MOUTH RINSE: 15.0000 mL | Freq: Once | OROMUCOSAL | Status: AC

## 2021-07-19 MED ORDER — INSULIN ASPART 100 UNIT/ML IJ SOLN
0.0000 [IU] | Freq: Three times a day (TID) | INTRAMUSCULAR | Status: DC
  Administered 2021-07-20: 5 [IU] via SUBCUTANEOUS
  Administered 2021-07-20 (×2): 11 [IU] via SUBCUTANEOUS
  Administered 2021-07-21: 3 [IU] via SUBCUTANEOUS

## 2021-07-19 MED ORDER — LACTATED RINGERS IV SOLN: INTRAVENOUS | Status: DC

## 2021-07-19 MED ORDER — SODIUM CHLORIDE 0.9 % IV SOLN: INTRAVENOUS | Status: AC

## 2021-07-19 MED ORDER — HEPARIN SODIUM (PORCINE) 5000 UNIT/ML IJ SOLN
5000.0000 [IU] | Freq: Three times a day (TID) | INTRAMUSCULAR | Status: DC
  Administered 2021-07-20 – 2021-07-21 (×4): 5000 [IU] via SUBCUTANEOUS
  Filled 2021-07-19 (×4): qty 1

## 2021-07-19 SURGICAL SUPPLY — 45 items
BAG COUNTER SPONGE SURGICOUNT (BAG) ×2 IMPLANT
BENZOIN TINCTURE PRP APPL 2/3 (GAUZE/BANDAGES/DRESSINGS) ×2 IMPLANT
CANISTER SUCT 3000ML PPV (MISCELLANEOUS) ×2 IMPLANT
CANISTER WOUNDNEG PRESSURE 500 (CANNISTER) ×2 IMPLANT
CANNULA VESSEL 3MM 2 BLNT TIP (CANNULA) ×4 IMPLANT
CHLORAPREP W/TINT 26 (MISCELLANEOUS) ×2 IMPLANT
CLIP VESOCCLUDE MED 24/CT (CLIP) ×2 IMPLANT
CLIP VESOCCLUDE SM WIDE 24/CT (CLIP) ×2 IMPLANT
DRAIN CHANNEL 15F RND FF W/TCR (WOUND CARE) IMPLANT
DRESSING PEEL AND PLC PRVNA 13 (GAUZE/BANDAGES/DRESSINGS) ×1 IMPLANT
DRSG PEEL AND PLACE PREVENA 13 (GAUZE/BANDAGES/DRESSINGS) ×2
ELECT REM PT RETURN 9FT ADLT (ELECTROSURGICAL) ×2
ELECTRODE REM PT RTRN 9FT ADLT (ELECTROSURGICAL) ×1 IMPLANT
EVACUATOR SILICONE 100CC (DRAIN) IMPLANT
GAUZE 4X4 16PLY ~~LOC~~+RFID DBL (SPONGE) ×2 IMPLANT
GAUZE SPONGE 4X4 12PLY STRL (GAUZE/BANDAGES/DRESSINGS) ×2 IMPLANT
GLOVE SURG POLYISO LF SZ8 (GLOVE) ×2 IMPLANT
GOWN STRL REUS W/ TWL LRG LVL3 (GOWN DISPOSABLE) ×2 IMPLANT
GOWN STRL REUS W/ TWL XL LVL3 (GOWN DISPOSABLE) ×1 IMPLANT
GOWN STRL REUS W/TWL LRG LVL3 (GOWN DISPOSABLE) ×2
GOWN STRL REUS W/TWL XL LVL3 (GOWN DISPOSABLE) ×1
HEMOSTAT SNOW SURGICEL 2X4 (HEMOSTASIS) ×2 IMPLANT
KIT BASIN OR (CUSTOM PROCEDURE TRAY) ×2 IMPLANT
KIT DRSG PREVENA PLUS 7DAY 125 (MISCELLANEOUS) ×2 IMPLANT
KIT TURNOVER KIT B (KITS) ×2 IMPLANT
NS IRRIG 1000ML POUR BTL (IV SOLUTION) ×4 IMPLANT
PACK PERIPHERAL VASCULAR (CUSTOM PROCEDURE TRAY) ×2 IMPLANT
PAD ARMBOARD 7.5X6 YLW CONV (MISCELLANEOUS) ×4 IMPLANT
SET WALTER ACTIVATION W/DRAPE (SET/KITS/TRAYS/PACK) ×2 IMPLANT
SPONGE T-LAP 18X18 ~~LOC~~+RFID (SPONGE) ×2 IMPLANT
SUT ETHILON 3 0 PS 1 (SUTURE) IMPLANT
SUT MNCRL AB 4-0 PS2 18 (SUTURE) ×2 IMPLANT
SUT PROLENE 5 0 C 1 24 (SUTURE) ×8 IMPLANT
SUT PROLENE 6 0 BV (SUTURE) ×6 IMPLANT
SUT SILK 2 0 SH (SUTURE) ×2 IMPLANT
SUT SILK 3 0 (SUTURE) ×1
SUT SILK 3-0 18XBRD TIE 12 (SUTURE) ×1 IMPLANT
SUT VIC AB 2-0 CT1 27 (SUTURE) ×3
SUT VIC AB 2-0 CT1 TAPERPNT 27 (SUTURE) ×3 IMPLANT
SUT VIC AB 3-0 SH 27 (SUTURE) ×1
SUT VIC AB 3-0 SH 27X BRD (SUTURE) ×1 IMPLANT
TOWEL GREEN STERILE (TOWEL DISPOSABLE) ×4 IMPLANT
TOWEL GREEN STERILE FF (TOWEL DISPOSABLE) ×2 IMPLANT
UNDERPAD 30X36 HEAVY ABSORB (UNDERPADS AND DIAPERS) IMPLANT
WATER STERILE IRR 1000ML POUR (IV SOLUTION) ×2 IMPLANT

## 2021-07-19 NOTE — Progress Notes (Signed)
Pt arrived to unit from  PACU VSS, A/O x 4,  CCMD called ,CHG given, pt complains of groin pain . Groin has wound vac placed and soft.pt oriented to unit,Will continue to monitor.   Karna Christmas Chancelor Hardrick, RN    07/19/21 1637  Vitals  Temp 97.8 F (36.6 C)  Temp Source Oral  BP (!) 146/72 (146/75)  MAP (mmHg) 91  BP Location Right Arm  BP Method Automatic  Patient Position (if appropriate) Lying  Pulse Rate 87  Pulse Rate Source Monitor  ECG Heart Rate 89  Resp 20  Level of Consciousness  Level of Consciousness Alert  Oxygen Therapy  SpO2 98 %  O2 Device Room Air  O2 Flow Rate (L/min) 0 L/min  Pain Assessment  Pain Scale 0-10  Pain Score 5  Pain Type Acute pain  Pain Location Groin  MEWS Score  MEWS Temp 0  MEWS Systolic 0  MEWS Pulse 0  MEWS RR 0  MEWS LOC 0  MEWS Score 0  MEWS Score Color [place] Si

## 2021-07-19 NOTE — Op Note (Signed)
DATE OF SERVICE: 07/19/2021  PATIENT:  Ashley [name]  65 y.o. 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  ANESTHESIA:   general  EBL:  BLOOD ADMINISTERED:none  DRAINS: none   LOCAL MEDICATIONS USED:  NONE  SPECIMEN:  none  COUNTS: confirmed correct.  TOURNIQUET:  none  PATIENT DISPOSITION:  PACU - hemodynamically stable.   Delay start of Pharmacological VTE agent (>24hrs) due to surgical blood loss or risk of bleeding: no  INDICATION FOR PROCEDURE: Ashley [name] is a 65 y.o. female with atherosclerosis of native arteries of right lower extremity with ischemic rest pain. After careful discussion of risks, benefits, and alternatives the patient was offered right femoral endarterectomy. The patient  understood and wished to proceed.  OPERATIVE FINDINGS: Occluded right terminal external iliac and common femoral artery.  Extensive exposure needed into the pelvis.  Unremarkable saphenous vein harvest.  Good result from endarterectomy.    DESCRIPTION OF PROCEDURE: After identification of the patient in the pre-operative holding area, the patient was transferred to the operating room. The patient was positioned supine on the operating room table. Anesthesia was induced. The abdomen, right groin, right thigh were prepped and draped in standard fashion. A surgical pause was performed confirming correct patient, procedure, and operative location.  A longitudinal incision was made over the course of the common femoral artery in the right groin.  This was carried down through subcutaneous tissue  until the femoral sheath was encountered and sharply divided.  The common femoral artery was identified and skeletonized.  Exposure was carried cranially under the inguinal ligament into the external iliac artery.  A palpable pulse was noted immediately proximal to the medial circumflex iliac branch.  This appeared suitable for cross-clamp.  Distally exposure was carried down onto the profunda femoris artery which similarly appeared soft and suitable for cross-clamp.  The right greater saphenous vein was identified coursing from the fossa ovalis distally in the leg a 6 cm aspect of the proximal saphenous vein was exposed and all side branches ligated and divided.  2 clips were placed distally on the vein and it was excised.  A baby Earl Lites clamp was applied to the saphenofemoral junction and then the saphenous vein excised completely and allowed to dwell in a heparinized saline solution until ready for patch angioplasty.  The saphenofemoral junction was sutured with a 2 layer closure of 5-0 Prolene.  Patient was systemically heparinized.  Activated clotting time measurements were used throughout the case to confirm adequate anticoagulation.  Clamps were applied to the external iliac artery and the profunda femoris artery.  A anterior arteriotomy was made with 11 blade and extended with Potts scissors.  The occlusive plaque in the common femoral artery and external iliac artery were removed using standard technique with a Astronomer performing an adequate endarterectomy.  I extended the arteriotomy onto the profunda femoris artery and confirmed a good endpoint here.  Inflow was confirmed to be torrential.  Backbleeding was confirmed to be good.  The saphenous vein patch was brought on the field and cut in half lengthwise to form a patch.  This was sewn onto  the arteriotomy using continuous running suture of 5-0 Prolene.  Immediately prior to completion the repair was flushed and de-aired.  Clamps were released  on the profunda femoris and the external iliac artery respectively.  Hemostasis was achieved in the patch.  Some venous bleeding was noted from the saphenous vein harvest site which resolved with empiric suture placement.  Hemostasis was achieved in the surgical bed.  Doppler machine was used to confirm adequate flow in and out of the repair.  Heparin was reversed with protamine.  The wound was closed in layers using 2-0 Vicryl, 3-0 Vicryl, 4-0 Monocryl.  A Prevena VAC was applied to the skin.  Upon completion of the case instrument and sharps counts were confirmed correct. The patient was transferred to the PACU in good condition. I was present for all portions of the procedure.  [name]. [name], MD [facility] Phone Number: [phone] 07/19/2021 2:34 PM

## 2021-07-19 NOTE — Anesthesia Postprocedure Evaluation (Signed)
Anesthesia Post Note  Patient: Conservation officer, nature  Procedure(s) Performed: RIGHT ILIO-FEMORAL ENDARTERECTOMY (Right: Groin) APPLICATION OF WOUND VAC (Right: Groin) RIGHT SAPHENOUS VEIN HARVEST (Right: Groin) PROFUNDOPLASTY (Right: Groin)     Patient location during evaluation: PACU Anesthesia Type: General Level of consciousness: awake and alert Pain management: pain level controlled Vital Signs Assessment: post-procedure vital signs reviewed and stable Respiratory status: spontaneous breathing, nonlabored ventilation, respiratory function stable and patient connected to nasal cannula oxygen Cardiovascular status: blood pressure returned to baseline and stable Postop Assessment: no apparent nausea or vomiting Anesthetic complications: no   No notable events documented.  Last Vitals:  Vitals:   07/19/21 1637 07/19/21 1700  BP: (!) 146/72 (!) 131/53  Pulse: 87 90  Resp: 20 18  Temp: 36.6 C   SpO2: 98% 98%    Last Pain:  Vitals:   07/19/21 1637  TempSrc: Oral  PainSc: 5                  Kennieth Rad

## 2021-07-19 NOTE — Transfer of Care (Signed)
Immediate Anesthesia Transfer of Care Note  Patient: Ashley [name]  Procedure(s) Performed: RIGHT ILIO-FEMORAL ENDARTERECTOMY (Right: Groin) APPLICATION OF WOUND VAC (Right: Groin) RIGHT SAPHENOUS VEIN HARVEST (Right: Groin) PROFUNDOPLASTY (Right: Groin)  Patient Location: PACU  Anesthesia Type:General  Level of Consciousness: awake, oriented and patient cooperative  Airway & Oxygen Therapy: Patient Spontanous Breathing and Patient connected to nasal cannula oxygen  Post-op Assessment: Report given to RN, Post -op Vital signs reviewed and stable and Patient moving all extremities  Post vital signs: Reviewed and stable  Last Vitals:  Vitals Value Taken Time  BP 141/61 07/19/21 1411  Temp    Pulse 87 07/19/21 1415  Resp 12 07/19/21 1415  SpO2 97 % 07/19/21 1415  Vitals shown include unvalidated device data.  Last Pain:  Vitals:   07/19/21 0936  TempSrc:   PainSc: 5       Patients Stated Pain Goal: 2 (07/19/21 0936)  Complications: No notable events documented.

## 2021-07-19 NOTE — Anesthesia Procedure Notes (Signed)
Procedure Name: Intubation Date/Time: 07/19/2021 10:45 AM Performed by: Lowella Dell, CRNA Pre-anesthesia Checklist: Patient identified, Emergency Drugs available, Suction available and Patient being monitored Patient Re-evaluated:Patient Re-evaluated prior to induction Oxygen Delivery Method: Circle System Utilized Preoxygenation: Pre-oxygenation with 100% oxygen Induction Type: IV induction Ventilation: Mask ventilation without difficulty and Oral airway inserted - appropriate to patient size Laryngoscope Size: Mac and 3 Grade View: Grade I Tube type: Oral Number of attempts: 1 Airway Equipment and Method: Stylet Placement Confirmation: ETT inserted through vocal cords under direct vision, positive ETCO2 and breath sounds checked- equal and bilateral Secured at: 22 cm Tube secured with: Tape Dental Injury: Teeth and Oropharynx as per pre-operative assessment

## 2021-07-19 NOTE — Anesthesia Preprocedure Evaluation (Signed)
Anesthesia Evaluation  Patient identified by MRN, date of birth, ID band Patient awake    Reviewed: Allergy & Precautions, NPO status , Patient's Chart, lab work & pertinent test results  Airway Mallampati: II  TM Distance: >3 FB Neck ROM: Full    Dental  (+) Dental Advisory Given   Pulmonary former smoker,    breath sounds clear to auscultation       Cardiovascular hypertension, Pt. on medications and Pt. on home beta blockers + Peripheral Vascular Disease   Rhythm:Regular Rate:Normal     Neuro/Psych negative neurological ROS     GI/Hepatic negative GI ROS, Neg liver ROS,   Endo/Other  diabetes, Type 2, Oral Hypoglycemic Agents, Insulin Dependent  Renal/GU negative Renal ROS     Musculoskeletal   Abdominal   Peds  Hematology   Anesthesia Other Findings   Reproductive/Obstetrics                             Lab Results  Component Value Date   WBC 7.8 07/17/2021   HGB 11.5 (L) 07/17/2021   HCT 37.7 07/17/2021   MCV 89.5 07/17/2021   PLT 298 07/17/2021   Lab Results  Component Value Date   CREATININE 0.51 07/17/2021   BUN 8 07/17/2021   NA 138 07/17/2021   K 4.2 07/17/2021   CL 101 07/17/2021   CO2 27 07/17/2021    Anesthesia Physical Anesthesia Plan  ASA: 3  Anesthesia Plan: General   Post-op Pain Management:    Induction: Intravenous  PONV Risk Score and Plan: 3 and Dexamethasone, Ondansetron and Treatment may vary due to age or medical condition  Airway Management Planned: Oral ETT  Additional Equipment: Arterial line  Intra-op Plan:   Post-operative Plan: Extubation in OR  Informed Consent: I have reviewed the patients History and Physical, chart, labs and discussed the procedure including the risks, benefits and alternatives for the proposed anesthesia with the patient or authorized representative who has indicated his/her understanding and acceptance.      Dental advisory given  Plan Discussed with: CRNA  Anesthesia Plan Comments:         Anesthesia Quick Evaluation

## 2021-07-19 NOTE — H&P (Signed)
[facility] [place]  ASSESSMENT / PLAN: 65 y.o. female with ischemic rest pain of the right lower extremity.  She underwent treatment of right common iliac artery stenosis with Dr. Loreta Ave 04/30/21.  She unfortunately still has rest pain.  Plan right femoral / profunda endarterectomy today in OR.  CHIEF COMPLAINT: Right leg pain  HISTORY OF PRESENT ILLNESS: Ashley [name] is a 65 y.o. female referred to clinic by Dr. Loreta Ave for evaluation of right lower extremity ischemic rest pain.  Patient reports she has extreme pain with ambulation.  Describes constant pain in her foot typical of ischemic rest pain.  She has no wounds about her foot stepped for the dorsalis pedis access site from Dr. Kenna Gilbert angiogram.  This appears to be healing.  Past medical history: DM2 Hypertension GERD Peripheral arterial disease  Past Surgical History:  Procedure Laterality Date   CHOLECYSTECTOMY     IR ANGIOGRAM EXTREMITY RIGHT  04/30/2021   IR ILIAC ART STENT INC PTA EA ADD IPSILAT MOD SED  04/30/2021   IR US GUIDE VASC ACCESS RIGHT  04/30/2021   IR US GUIDE VASC ACCESS RIGHT  04/30/2021    History reviewed. No pertinent family history.  Social History   Socioeconomic History   Marital status: Married    Spouse name: Not on file   Number of children: Not on file   Years of education: Not on file   Highest education level: Not on file  Occupational History   Not on file  Tobacco Use   Smoking status: Former    Packs/day: 1.50    Types: Cigarettes   Smokeless tobacco: Never  Substance and Sexual Activity   Alcohol use: Not Currently   Drug use: Never   Sexual activity: Not on file  Other Topics Concern   Not on file  Social History Narrative   Not on file   Social Determinants of Health   Financial Resource Strain: Not on file  Food Insecurity: Not on file  Transportation Needs: Not on file  Physical Activity: Not on file  Stress: Not on file  Social  Connections: Not on file  Intimate Partner Violence: Not on file    Allergies  Allergen Reactions   Codeine     Upset stomach    Current Facility-Administered Medications  Medication Dose Route Frequency Provider Last Rate Last Admin   0.9 %  sodium chloride infusion   Intravenous Continuous Leonie Douglas, MD       ceFAZolin (ANCEF) IVPB 2g/100 mL premix  2 g Intravenous 30 min Pre-Op Leonie Douglas, MD       Chlorhexidine Gluconate Cloth 2 % PADS 6 each  6 each Topical Once Leonie Douglas, MD       And   Chlorhexidine Gluconate Cloth 2 % PADS 6 each  6 each Topical Once Leonie Douglas, MD       insulin aspart (novoLOG) injection 4 Units  4 Units Subcutaneous Once Marcene Duos, MD       lactated ringers infusion   Intravenous Continuous Marcene Duos, MD 10 mL/hr at 07/19/21 0949 New Bag at 07/19/21 0949    REVIEW OF SYSTEMS:  [X]  denotes positive finding, [ ]  denotes negative finding Cardiac  Comments:  Chest pain or chest pressure:    Shortness of breath upon exertion:    Short of breath when lying flat:    Irregular heart rhythm:        Vascular    Pain  in calf, thigh, or hip brought on by ambulation:    Pain in feet at night that wakes you up from your sleep:     Blood clot in your veins:    Leg swelling:         Pulmonary    Oxygen at home:    Productive cough:     Wheezing:         Neurologic    Sudden weakness in arms or legs:     Sudden numbness in arms or legs:     Sudden onset of difficulty speaking or slurred speech:    Temporary loss of vision in one eye:     Problems with dizziness:         Gastrointestinal    Blood in stool:     Vomited blood:         Genitourinary    Burning when urinating:     Blood in urine:        Psychiatric    Major depression:         Hematologic    Bleeding problems:    Problems with blood clotting too easily:        Skin    Rashes or ulcers:        Constitutional    Fever or chills:       PHYSICAL EXAM Vitals:   07/19/21 0900 07/19/21 0944  BP: (!) 171/67   Pulse: 77   Resp: 18   Temp: 98 F (36.7 C)   TempSrc: Oral   SpO2: 96%   Weight:  79.2 kg  Height:  5\' 4"  (1.626 m)    Constitutional: well appearing. no distress. Appears well nourished.  Neurologic: CN intact. no focal findings. no sensory loss. Psychiatric:  Mood and affect symmetric and appropriate. Eyes:  No icterus. No conjunctival pallor. Ears, nose, throat:  mucous membranes moist. Midline trachea.  Cardiac: regular rate and rhythm.  Respiratory:  unlabored. Abdominal:  soft, non-tender, non-distended.  Peripheral vascular: No palpable femoral pulses  No palpable pedal pulses Extremity: no edema. no cyanosis. no pallor.  Skin: no gangrene. no ulceration. DP access site appears to be healing. Lymphatic: No Stemmer's sign. No palpable lymphadenopathy.  PERTINENT LABORATORY AND RADIOLOGIC DATA  Most recent CBC CBC Latest Ref Rng & Units 07/17/2021 04/30/2021  WBC 4.0 - 10.5 K/uL 7.8 7.9  Hemoglobin 12.0 - 15.0 g/dL 11.5(L) 12.0  Hematocrit 36.0 - 46.0 % 37.7 38.7  Platelets 150 - 400 K/uL 298 287     Most recent CMP CMP Latest Ref Rng & Units 07/17/2021 04/30/2021  Glucose 70 - 99 mg/dL 06/30/2021) 409(W)  BUN 8 - 23 mg/dL 8 11  Creatinine 119(J - 1.00 mg/dL 4.78 2.95  Sodium 6.21 - 145 mmol/L 138 137  Potassium 3.5 - 5.1 mmol/L 4.2 5.1  Chloride 98 - 111 mmol/L 101 102  CO2 22 - 32 mmol/L 27 25  Calcium 8.9 - 10.3 mg/dL 9.4 308  Total Protein 6.5 - 8.1 g/dL 7.6 -  Total Bilirubin 0.3 - 1.2 mg/dL 1.2 -  Alkaline Phos 38 - 126 U/L 62 -  AST 15 - 41 U/L 70(H) -  ALT 0 - 44 U/L 40 -   Dr. 65.7 angiogram from 04/30/2021 personally reviewed Common femoral plaque is difficult to visualize because of sheath position.  Good technical result from common iliac artery stenting.  Superficial femoral artery appears occluded and reconstitutes about the knee.  Retrograde attempt at revascularization not  successful.  [name]. [name], MD [facility] Phone Number: [phone] 07/19/2021 10:19 AM

## 2021-07-19 NOTE — Anesthesia Procedure Notes (Signed)
Arterial Line Insertion Start/End8/25/2022 10:00 AM, 07/19/2021 10:10 AM Performed by: Demetrio Lapping, CRNA, CRNA  Patient location: Pre-op. Preanesthetic checklist: patient identified, IV checked, site marked, risks and benefits discussed, surgical consent, monitors and equipment checked, pre-op evaluation, timeout performed and anesthesia consent Lidocaine 1% used for infiltration Left, radial was placed Catheter size: 20 G Hand hygiene performed  and maximum sterile barriers used   Attempts: 1 Procedure performed without using ultrasound guided technique. Following insertion, Biopatch and dressing applied. Post procedure assessment: normal  Patient tolerated the procedure well with no immediate complications.

## 2021-07-20 ENCOUNTER — Encounter (HOSPITAL_COMMUNITY): Payer: Self-pay | Admitting: Vascular Surgery

## 2021-07-20 LAB — GLUCOSE, CAPILLARY
Glucose-Capillary: 337 mg/dL — ABNORMAL HIGH (ref 70–99)
Glucose-Capillary: 314 mg/dL — ABNORMAL HIGH (ref 70–99)
Glucose-Capillary: 238 mg/dL — ABNORMAL HIGH (ref 70–99)
Glucose-Capillary: 288 mg/dL — ABNORMAL HIGH (ref 70–99)

## 2021-07-20 LAB — CBC
HCT: 25.2 % — ABNORMAL LOW (ref 36.0–46.0)
Hemoglobin: 8 g/dL — ABNORMAL LOW (ref 12.0–15.0)
MCH: 28 pg (ref 26.0–34.0)
MCHC: 31.7 g/dL (ref 30.0–36.0)
MCV: 88.1 fL (ref 80.0–100.0)
Platelets: 235 10*3/uL (ref 150–400)
RBC: 2.86 MIL/uL — ABNORMAL LOW (ref 3.87–5.11)
RDW: 15.6 % — ABNORMAL HIGH (ref 11.5–15.5)
WBC: 9.2 10*3/uL (ref 4.0–10.5)
nRBC: 0 % (ref 0.0–0.2)

## 2021-07-20 LAB — BASIC METABOLIC PANEL
Anion gap: 12 (ref 5–15)
BUN: 6 mg/dL — ABNORMAL LOW (ref 8–23)
CO2: 25 mmol/L (ref 22–32)
Calcium: 9.1 mg/dL (ref 8.9–10.3)
Chloride: 99 mmol/L (ref 98–111)
Creatinine, Ser: 0.56 mg/dL (ref 0.44–1.00)
GFR, Estimated: >60 mL/min (ref >60)
Glucose, Bld: 320 mg/dL — ABNORMAL HIGH (ref 70–99)
Potassium: 4.1 mmol/L (ref 3.5–5.1)
Sodium: 136 mmol/L (ref 135–145)

## 2021-07-20 MED ORDER — INSULIN NPH (HUMAN) (ISOPHANE) 100 UNIT/ML ~~LOC~~ SUSP
60.0000 [IU] | Freq: Every day | SUBCUTANEOUS | Status: DC
  Administered 2021-07-20 – 2021-07-21 (×2): 60 [IU] via SUBCUTANEOUS
  Filled 2021-07-20: qty 10

## 2021-07-20 MED ORDER — GLIPIZIDE 10 MG PO TABS
10.0000 mg | ORAL_TABLET | Freq: Two times a day (BID) | ORAL | Status: DC
  Administered 2021-07-20 – 2021-07-21 (×3): 10 mg via ORAL
  Filled 2021-07-20 (×4): qty 1

## 2021-07-20 NOTE — Evaluation (Signed)
Physical Therapy Evaluation Patient Details Name: Ashley [name] MRN: [id number] DOB: 11/01/1956 Today's Date: 07/20/2021   History of Present Illness  Pt is a 65 y/o female with R LE ischemia and subsequent pain. Pt underwent R iliofemoral endarterectomy on 8/25. PMH: DM, HTN, GERD, PAD  Clinical Impression  Pt is at or close to baseline functioning and should be safe at home with husband's assist. There are no further acute PT needs.  Will sign off at this time.    Follow Up Recommendations No PT follow up    Equipment Recommendations  Rolling walker with 5" wheels;3in1 (PT)    Recommendations for Other Services       Precautions / Restrictions Precautions Precautions: Fall;Other (comment) Precaution Comments: groin wound vac      Mobility  Bed Mobility Overal bed mobility: Needs Assistance Bed Mobility: Rolling;Sidelying to Sit Rolling: Supervision Sidelying to sit: Min assist       General bed mobility comments: Guided pt in log rolling to decrease pain with groin site. Light assist needed to lift trunk, use of bedrails    Transfers Overall transfer level: Needs assistance Equipment used: None Transfers: Sit to/from Stand Sit to Stand: Min assist;Min guard (min guard from higher toilet)         General transfer comment: from lower surface pt needed minimal assist.  Ambulation/Gait Ambulation/Gait assistance: Min guard Gait Distance (Feet): 200 Feet Assistive device: Rolling walker (2 wheeled) Gait Pattern/deviations: Step-through pattern   Gait velocity interpretation: 1.31 - 2.62 ft/sec, indicative of limited community ambulator General Gait Details: generally steadty, slower, able to scan her environment without deviation.  Stairs            Wheelchair Mobility    Modified Rankin (Stroke Patients Only)       Balance Overall balance assessment: Needs assistance Sitting-balance support: No upper extremity supported;Feet supported Sitting  balance-Leahy Scale: Fair     Standing balance support: Single extremity supported;During functional activity;No upper extremity supported Standing balance-Leahy Scale: Fair Standing balance comment: reliant on at least one UE support for dynamic tasks                             Pertinent Vitals/Pain Pain Assessment: Faces Faces Pain Scale: Hurts little more Pain Location: R groin Pain Descriptors / Indicators: Grimacing;Guarding;Sore    Home Living Family/patient expects to be discharged to:: Private residence Living Arrangements: Spouse/significant other Available Help at Discharge: Family;Available 24 hours/day Type of Home: Mobile home Home Access: Ramped entrance     Home Layout: One level Home Equipment: Shower seat Additional Comments: pt mother coming from [place] to stay with pt and assist    Prior Function Level of Independence: Independent         Comments: typically independent with ADLs and mobility though endorsing some difficulty recently. Husband assisting with IADLs     Hand Dominance   Dominant Hand: Right    Extremity/Trunk Assessment   Upper Extremity Assessment Upper Extremity Assessment: Defer to OT evaluation;Overall [facility] for tasks assessed    Lower Extremity Assessment Lower Extremity Assessment: Overall WFL for tasks assessed    Cervical / Trunk Assessment Cervical / Trunk Assessment: Normal  Communication   Communication: No difficulties  Cognition Arousal/Alertness: Awake/alert Behavior During Therapy: WFL for tasks assessed/performed Overall Cognitive Status: Within Functional Limits for tasks assessed  General Comments General comments (skin integrity, edema, etc.): vss on RA    Exercises     Assessment/Plan    PT Assessment Patent does not need any further PT services  PT Problem List         PT Treatment Interventions      PT Goals (Current goals  can be found in the Care Plan section)  Acute Rehab PT Goals Patient Stated Goal: decrease pain, be able to walk normally again PT Goal Formulation: All assessment and education complete, DC therapy    Frequency     Barriers to discharge        Co-evaluation               AM-PAC PT "6 Clicks" Mobility  Outcome Measure Help needed turning from your back to your side while in a flat bed without using bedrails?: A Little Help needed moving from lying on your back to sitting on the side of a flat bed without using bedrails?: A Little Help needed moving to and from a bed to a chair (including a wheelchair)?: A Little Help needed standing up from a chair using your arms (e.g., wheelchair or bedside chair)?: A Little Help needed to walk in hospital room?: A Little Help needed climbing 3-5 steps with a railing? : A Little 6 Click Score: 18    End of Session   Activity Tolerance: Patient tolerated treatment well Patient left: in bed;with call bell/phone within reach;with family/visitor present Nurse Communication: Mobility status PT Visit Diagnosis: Other abnormalities of gait and mobility (R26.89);Pain Pain - Right/Left: Right Pain - part of body:  (groin)    Time: 2130-8657 PT Time Calculation (min) (ACUTE ONLY): 26 min   Charges:     PT Treatments $Gait Training: 8-22 mins        07/20/2021  Jacinto Halim., PT Acute Rehabilitation Services [phone]  (pager) [phone]  (office)  Eliseo Gum Jahnasia Tatum 07/20/2021, 7:09 PM

## 2021-07-20 NOTE — Progress Notes (Addendum)
  Progress Note    07/20/2021 7:35 AM 1 Day Post-Op  Subjective:  says her foot is still numb  Afebrile 140's-150's systolic HR 70's-100's NSR 97% RA  Vitals:   07/19/21 2350 07/20/21 0438  BP: (!) 145/58 (!) 151/73  Pulse: 96 79  Resp: 13 17  Temp: 98.4 F (36.9 C) 98.4 F (36.9 C)  SpO2: 95% 97%    Physical Exam: Cardiac:  regular Lungs:  non labored Incisions:  right groin with pravena vac in place-there was not a seal but sealed down once machine turned on  Extremities:  brisk doppler signals right DP/PT   CBC    Component Value Date/Time   WBC 9.2 07/20/2021 0425   RBC 2.86 (L) 07/20/2021 0425   HGB 8.0 (L) 07/20/2021 0425   HCT 25.2 (L) 07/20/2021 0425   PLT 235 07/20/2021 0425   MCV 88.1 07/20/2021 0425   MCH 28.0 07/20/2021 0425   MCHC 31.7 07/20/2021 0425   RDW 15.6 (H) 07/20/2021 0425    BMET    Component Value Date/Time   NA 136 07/20/2021 0425   K 4.1 07/20/2021 0425   CL 99 07/20/2021 0425   CO2 25 07/20/2021 0425   GLUCOSE 320 (H) 07/20/2021 0425   BUN 6 (L) 07/20/2021 0425   CREATININE 0.56 07/20/2021 0425   CALCIUM 9.1 07/20/2021 0425   GFRNONAA >60 07/20/2021 0425    INR    Component Value Date/Time   INR 1.1 07/17/2021 1100     Intake/Output Summary (Last 24 hours) at 07/20/2021 0735 Last data filed at 07/20/2021 0400 Gross per 24 hour  Intake 2690.67 ml  Output 750 ml  Net 1940.67 ml     Assessment/Plan:  65 y.o. female is s/p:  1) right greater saphenous vein harvest 2) right iliofemoral endarterectomy and profundaplasty  1 Day Post-Op   -pt with brisk doppler signals right DP/PT and peroneal doppler signals -Pravena without seal-machine was off.  When machine was turned on and started, Pravena with good seal.  -mobilize out of bed today -discussed with pt that the numbness in the foot will take time to improve and may not ever get back to normal.  -DVT prophylaxis:  sq heparin -DM with hyperglycemia this  am-restart home Glipizide and insulin NPH 60 units sq daily. -continue asa/statin/Plavix   Doreatha Massed, PA-C [facility] [phone] 07/20/2021 7:35 AM  VASCULAR STAFF ADDENDUM: I have independently interviewed and examined the patient. I agree with the above.  POD#1 R iliofemoral endarterectomy / profundaplasty with saphenous vein patch. Prevena in place. R foot still numb / paresthesias. Encouraged her that foot may take time to improve. Mobilize as able. OK for discharge tomorrow.  [name]. [name], MD [facility] Phone Number: [phone] 07/20/2021 2:08 PM

## 2021-07-20 NOTE — Evaluation (Signed)
Occupational Therapy Evaluation Patient Details Name: Ashley [name] MRN: [id number] DOB: 09/03/1956 Today's Date: 07/20/2021    History of Present Illness Pt is a 65 y/o female with R LE ischemia and subsequent pain. Pt underwent R iliofemoral endarterectomy on 8/25. PMH: DM, HTN, GERD, PAD   Clinical Impression   PTA, pt lives with spouse and reports Independent with ADLs and mobility though increasingly difficult due to pain/LE numbness. Pt presents now with deficits in pain, standing balance, flexibility and endurance. RN in to remove A-line so opted away from RW use to avoid excessive pressure through L UE. Pt overall Min A for bed mobility and pivot to recliner with handheld assist. Pt Independent with ADLs and requires Mod A for LB ADLs due to deficits noted below. Education re: compensatory strategies for LB dressing, use of BSC over low toilet at home, and progression of mobility with RW at home. Pt reports her mother and husband will be able to assist as needed at home. Anticipate no need for OT follow-up at DC but will continue to follow acutely to progress ADL independence.     Follow Up Recommendations  No OT follow up;Supervision - Intermittent    Equipment Recommendations  3 in 1 bedside commode;Other (comment) (Rolling walker)    Recommendations for Other Services       Precautions / Restrictions Precautions Precautions: Fall;Other (comment) Precaution Comments: groin wound vac Restrictions Weight Bearing Restrictions: No      Mobility Bed Mobility Overal bed mobility: Needs Assistance Bed Mobility: Rolling;Sidelying to Sit Rolling: Supervision Sidelying to sit: Min assist       General bed mobility comments: Guided pt in log rolling to decrease pain with groin site. Light assist needed to lift trunk, use of bedrails    Transfers Overall transfer level: Needs assistance Equipment used: None Transfers: Sit to/from [employer] Sit to Stand:  Min guard Stand pivot transfers: Min assist       General transfer comment: min guard for sit to stand, no assist needed. Min A provided for steadying assist to take steps to chair (did not use RW to avoid L wrist pressure from a line removal)    Balance Overall balance assessment: Needs assistance Sitting-balance support: No upper extremity supported;Feet supported Sitting balance-Leahy Scale: Fair     Standing balance support: Single extremity supported;During functional activity Standing balance-Leahy Scale: Poor Standing balance comment: reliant on at least one UE support for dynamic tasks                           ADL either performed or assessed with clinical judgement   ADL Overall ADL's : Needs assistance/impaired Eating/Feeding: Set up;Sitting Eating/Feeding Details (indicate cue type and reason): assist to open containers due to L wrist discomfort after a line removal Grooming: Set up;Sitting;Brushing hair   Upper Body Bathing: Independent;Sitting   Lower Body Bathing: Sit to/from stand;Minimal assistance Lower Body Bathing Details (indicate cue type and reason): unable to reach L foot or R foot (groin wound vac pain). Assist to don socks Upper Body Dressing : Independent;Sitting   Lower Body Dressing: Moderate assistance;Sit to/from stand Lower Body Dressing Details (indicate cue type and reason): unable to reach L foot or R foot (groin wound vac pain). Assist to don socks Toilet Transfer: Minimal assistance;Stand-pivot Toilet Transfer Details (indicate cue type and reason): simulated to recliner Toileting- Clothing Manipulation and Hygiene: Minimal assistance;Sitting/lateral lean;Sit to/from stand  General ADL Comments: Pt with expected post op pain (R groin, wound vac site) and L wrist discomfort from a line removal (avoiding pressure with activity due to recent removal). Pt with difficulty reaching B feet and light assist needed to maintain  balance along with pain limitations. Educated on compensatory strategies for LB dressing, use of BSC over low toilet at home     Vision Baseline Vision/History: 1 Wears glasses Ability to See in Adequate Light: 0 Adequate Vision Assessment?: No apparent visual deficits     Perception     Praxis      Pertinent Vitals/Pain Pain Assessment: 0-10 Pain Score: 6  Pain Location: R groin Pain Descriptors / Indicators: Grimacing;Guarding;Sore Pain Intervention(s): Monitored during session;Limited activity within patient's tolerance;Repositioned     Hand Dominance Right   Extremity/Trunk Assessment Upper Extremity Assessment Upper Extremity Assessment: Overall WFL for tasks assessed;LUE deficits/detail LUE Deficits / Details: a line removed, some soreness with wrist movement   Lower Extremity Assessment Lower Extremity Assessment: Defer to PT evaluation   Cervical / Trunk Assessment Cervical / Trunk Assessment: Normal   Communication Communication Communication: No difficulties   Cognition Arousal/Alertness: Awake/alert Behavior During Therapy: WFL for tasks assessed/performed Overall Cognitive Status: Within Functional Limits for tasks assessed                                     General Comments  VSS on RA. RN in to remove a line during session    Exercises     Shoulder Instructions      Home Living Family/patient expects to be discharged to:: Private residence Living Arrangements: Spouse/significant other Available Help at Discharge: Family;Available 24 hours/day Type of Home: Mobile home Home Access: Ramped entrance     Home Layout: One level     Bathroom Shower/Tub: Chief Strategy Officer: Standard (low)     Home Equipment: Shower seat   Additional Comments: pt mother coming from [place] to stay with pt and assist      Prior Functioning/Environment Level of Independence: Independent        Comments: typically  independent with ADLs and mobility though endorsing some difficulty recently. Husband assisting with IADLs        OT Problem List: Decreased strength;Decreased activity tolerance;Impaired balance (sitting and/or standing);Pain;Decreased knowledge of use of DME or AE      OT Treatment/Interventions: Therapeutic exercise;Self-care/ADL training;DME and/or AE instruction;Therapeutic activities;Patient/family education;Balance training    OT Goals(Current goals can be found in the care plan section) Acute Rehab OT Goals Patient Stated Goal: decrease pain, be able to walk normally again OT Goal Formulation: With patient Time For Goal Achievement: 08/03/21 Potential to Achieve Goals: Good  OT Frequency: Min 2X/week   Barriers to D/C:            Co-evaluation              AM-PAC OT "6 Clicks" Daily Activity     Outcome Measure Help from another person eating meals?: A Little Help from another person taking care of personal grooming?: A Little Help from another person toileting, which includes using toliet, bedpan, or urinal?: A Little Help from another person bathing (including washing, rinsing, drying)?: A Lot Help from another person to put on and taking off regular upper body clothing?: A Little Help from another person to put on and taking off regular lower body clothing?: A Lot 6 Click  Score: 16   End of Session Nurse Communication: Mobility status  Activity Tolerance: Patient tolerated treatment well Patient left: in chair;with call bell/phone within reach;with chair alarm set  OT Visit Diagnosis: Unsteadiness on feet (R26.81);Other abnormalities of gait and mobility (R26.89);Pain Pain - Right/Left: Right Pain - part of body:  (groin)                Time: 9629-5284 OT Time Calculation (min): 40 min Charges:  OT General Charges $OT Visit: 1 Visit OT Evaluation $OT Eval Moderate Complexity: 1 Mod OT Treatments $Therapeutic Activity: 8-22 mins  Bradd Canary,  OTR/L Acute Rehab Services Office: [phone]   Lorre Munroe 07/20/2021, 8:55 AM

## 2021-07-21 LAB — GLUCOSE, CAPILLARY: Glucose-Capillary: 186 mg/dL — ABNORMAL HIGH (ref 70–99)

## 2021-07-21 MED ORDER — OXYCODONE-ACETAMINOPHEN 5-325 MG PO TABS: 1.0000 | ORAL_TABLET | Freq: Four times a day (QID) | ORAL | 0 refills | Status: DC | PRN

## 2021-07-21 MED ORDER — OXYCODONE-ACETAMINOPHEN 5-325 MG PO TABS: 1.0000 | ORAL_TABLET | Freq: Four times a day (QID) | ORAL | Status: DC | PRN

## 2021-07-21 NOTE — Progress Notes (Signed)
Discharge instructions provided to patient. All medications, follow up[ appointments, and discharge instructions discussed. IV out. Monitor off CCMD notified. Discharging to home with husband.  Sabra Heck, RN

## 2021-07-21 NOTE — Discharge Summary (Signed)
Discharge Summary     Ashley [name] Jan 14, 1956 65 y.o. female  [id number]  Admission Date: 07/19/2021  Discharge Date: 07/23/2021  Physician: No att. providers found  Admission Diagnosis: PAD (peripheral artery disease) (HCC) [I73.9]  HPI:   This is a 65 y.o. female with atherosclerosis of native arteries of right lower extremity with ischemic rest pain. After careful discussion of risks, benefits, and alternatives the patient was offered right femoral endarterectomy. The patient  understood and wished to proceed.  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  Extensive exposure needed into the pelvis.  Unremarkable saphenous vein harvest.  Good result from endarterectomy.    The pt tolerated the procedure well and was transported to the PACU in good condition.   By POD 1, pt had palpable right DP pulse and brisk right DP/PT doppler signals.  Prevena vac in place.    Discussed with pt that the numbness in the foot will take time to improve and may not ever get back to normal.    POD 2, pt had palpable DP pulse on the right.  Again explained to to pt that it will take time to see what feeling she will get back in her foot.  Right groin with Prevena in place-will need to keep in place until it loses its seal.  Discussion with pt about vac and instructions in discharge instructions.   -PT recommending no PT f/u but RW and 3in1.  TOC consulted and orders for DME placed.  discussed with Dr. Chestine Sporelark and given pt has had surgery, will prescribe Percocet 5/325 one q6h prn pain #12 no refill for surgical pain.  She will need to contact her pain management MD for further refills.   CBC    Component Value Date/Time   WBC 9.2 07/20/2021 0425    RBC 2.86 (L) 07/20/2021 0425   HGB 8.0 (L) 07/20/2021 0425   HCT 25.2 (L) 07/20/2021 0425   PLT 235 07/20/2021 0425   MCV 88.1 07/20/2021 0425   MCH 28.0 07/20/2021 0425   MCHC 31.7 07/20/2021 0425   RDW 15.6 (H) 07/20/2021 0425    BMET    Component Value Date/Time   NA 136 07/20/2021 0425   K 4.1 07/20/2021 0425   CL 99 07/20/2021 0425   CO2 25 07/20/2021 0425   GLUCOSE 320 (H) 07/20/2021 0425   BUN 6 (L) 07/20/2021 0425   CREATININE 0.56 07/20/2021 0425   CALCIUM 9.1 07/20/2021 0425   GFRNONAA >60 07/20/2021 0425     Discharge Instructions     Discharge patient   Complete by: As directed    Discharge once Dr. Chestine Sporelark has seen pt and HH needs have been arranged.  Thanks   Discharge disposition: 01-Home or Self Care   Discharge patient date: 07/21/2021       Discharge Diagnosis:  PAD (peripheral artery disease) (HCC) [I73.9]  Secondary Diagnosis: Patient Active Problem List   Diagnosis Date Noted   PAD (peripheral artery disease) (HCC) 07/19/2021   NONSPECIFIC ABN FINDNG RAD&OTH EXAM BILARY TRCT 02/15/2009   Past Medical History:  Diagnosis Date   Anxiety    Diabetes mellitus without complication (HCC)    Hypertension    Peripheral vascular disease (HCC)      Allergies as of 07/21/2021  Reactions   Codeine    Upset stomach        Medication List     STOP taking these medications    aspirin 325 MG tablet       TAKE these medications    acetaminophen 500 MG tablet Commonly known as: TYLENOL Take 1,000 mg by mouth every 6 (six) hours as needed for moderate pain or headache.   acyclovir 400 MG tablet Commonly known as: ZOVIRAX Take 400 mg by mouth daily as needed (fever blisters).   ALPRAZolam 0.5 MG tablet Commonly known as: XANAX Take 0.5 mg by mouth daily as needed for anxiety.   atorvastatin 80 MG tablet Commonly known as: LIPITOR Take 80 mg by mouth daily.   clopidogrel 75 MG tablet Commonly known as: Plavix Take 1  tablet (75 mg total) by mouth daily.   cyclobenzaprine 5 MG tablet Commonly known as: FLEXERIL Take 5 mg by mouth at bedtime.   glipiZIDE 5 MG tablet Commonly known as: GLUCOTROL Take 10 mg by mouth in the morning and at bedtime.   HumuLIN N 100 UNIT/ML injection Generic drug: insulin NPH Human Inject 60 Units into the skin daily.   hydrochlorothiazide 25 MG tablet Commonly known as: HYDRODIURIL Take 12.5 mg by mouth daily.   linaclotide 290 MCG Caps capsule Commonly known as: LINZESS Take 290 mcg by mouth daily.   losartan 100 MG tablet Commonly known as: COZAAR Take 100 mg by mouth daily.   metFORMIN 1000 MG tablet Commonly known as: GLUCOPHAGE Take 1,000-1,500 mg by mouth See admin instructions. Take 1000 mg in the morning and 1500 mg at night   metoprolol tartrate 25 MG tablet Commonly known as: LOPRESSOR Take 37.5 mg by mouth 2 (two) times daily.   OVER THE COUNTER MEDICATION Take 2 capsules by mouth daily. Keto otc supplement   oxyCODONE-acetaminophen 5-325 MG tablet Commonly known as: Percocet Take 1 tablet by mouth every 6 (six) hours as needed for severe pain.   pantoprazole 40 MG tablet Commonly known as: PROTONIX Take 40 mg by mouth daily.   potassium chloride 10 MEQ CR capsule Commonly known as: MICRO-K Take 10 mEq by mouth daily.   traMADol 50 MG tablet Commonly known as: ULTRAM Take 50 mg by mouth 3 (three) times daily as needed for pain.   Trulicity 1.5 MG/0.5ML Sopn Generic drug: Dulaglutide Inject 1.5 mg into the skin every Wednesday. Notes to patient: Take as you were prior to admission    VISINE OP Place 1 drop into both eyes daily as needed (irritation).        Discharge Instructions: [facility] Discharge instructions Lower Extremity Bypass Surgery  Please refer to the following instruction for your post-procedure care. Your surgeon or physician assistant will discuss any changes with  you.  Activity  You are encouraged to walk as much as you can. You can slowly return to normal activities during the month after your surgery. Avoid strenuous activity and heavy lifting until your doctor tells you it's OK. Avoid activities such as vacuuming or swinging a golf club. Do not drive until your doctor give the OK and you are no longer taking prescription pain medications. It is also normal to have difficulty with sleep habits, eating and bowel movement after surgery. These will go away with time.  Bathing/Showering  You may shower after you go home. Do not soak in a bathtub, hot tub, or swim until the incision heals completely.  Incision Care  Clean your incision with mild soap and water. Shower every day. Pat the area dry with a clean towel. You do not need a bandage unless otherwise instructed. Do not apply any ointments or creams to your incision. If you have open wounds you will be instructed how to care for them or a visiting nurse may be arranged for you. If you have staples or sutures along your incision they will be removed at your post-op appointment. You may have skin glue on your incision. Do not peel it off. It will come off on its own in about one week.  Wash the groin wound with soap and water daily and pat dry. (No tub bath-only shower)  Then put a dry gauze or washcloth in the groin to keep this area dry to help prevent wound infection.  Do this daily and as needed.  Do not use Vaseline or neosporin on your incisions.  Only use soap and water on your incisions and then protect and keep dry.  Diet  Resume your normal diet. There are no special food restrictions following this procedure. A low fat/ low cholesterol diet is recommended for all patients with vascular disease. In order to heal from your surgery, it is CRITICAL to get adequate nutrition. Your body requires vitamins, minerals, and protein. Vegetables are the best source of vitamins and minerals. Vegetables also  provide the perfect balance of protein. Processed food has little nutritional value, so try to avoid this.  Medications  Resume taking all your medications unless your doctor or Physician Assistant tells you not to. If your incision is causing pain, you may take over-the-counter pain relievers such as acetaminophen (Tylenol). If you were prescribed a stronger pain medication, please aware these medication can cause nausea and constipation. Prevent nausea by taking the medication with a snack or meal. Avoid constipation by drinking plenty of fluids and eating foods with high amount of fiber, such as fruits, vegetables, and grains. Take Colace 100 mg (an over-the-counter stool softener) twice a day as needed for constipation.  Do not take Tylenol if you are taking prescription pain medications.  Follow Up  Our office will schedule a follow up appointment 2-3 weeks following discharge.  Please call us immediately for any of the following conditions  Severe or worsening pain in your legs or feet while at rest or while walking Increase pain, redness, warmth, or drainage (pus) from your incision site(s) Fever of 101 degree or higher The swelling in your leg with the bypass suddenly worsens and becomes more painful than when you were in the hospital If you have been instructed to feel your graft pulse then you should do so every day. If you can no longer feel this pulse, call the office immediately. Not all patients are given this instruction.  Leg swelling is common after leg bypass surgery.  The swelling should improve over a few months following surgery. To improve the swelling, you may elevate your legs above the level of your heart while you are sitting or resting. Your surgeon or physician assistant may ask you to apply an ACE wrap or wear compression (TED) stockings to help to reduce swelling.  Reduce your risk of vascular disease  Stop smoking. If you would like help call [facility] at  1-800-QUIT-NOW (479-644-6648) or [place] at [phone].  Manage your cholesterol Maintain a desired weight Control your diabetes weight Control your diabetes Keep your blood pressure down  If you have any questions, please call the office at  [phone]   Prescriptions given: 1.  Roxicet:  -discussed with Dr. Chestine Spore and given pt has had surgery, will prescribe Percocet 5/325 one q6h prn pain #12 no refill for surgical pain.  She will need to contact her pain management MD for further refills. (Pt receives Tramadol monthly for scoliosis).  Disposition: home  Patient's condition: is Good  Follow up: 1. [facility] in 2-3 weeks   Doreatha Massed, PA-C [facility] [phone] 07/23/2021  9:48 AM  - For VQI Registry use ---   Post-op:  Wound infection: No  Graft infection: No  Transfusion: No    If yes, n/a units given New Arrhythmia: No Ipsilateral amputation: No, [ ]  Minor, [ ]  BKA, [ ]  AKA Discharge patency: [x ] Primary, [ ]  Primary assisted, [ ]  Secondary, [ ]  Occluded Patency judged by: [ ]  Dopper only, [ ]  Palpable graft pulse, [x]  Palpable distal pulse, [ ]  ABI inc. > 0.15, [ ]  Duplex Discharge ABI: R not done, L  D/C Ambulatory Status: Ambulatory  Complications: MI: No, [ ]  Troponin only, [ ]  EKG or Clinical CHF: No Resp failure:No, [ ]  Pneumonia, [ ]  Ventilator Chg in renal function: No, [ ]  Inc. Cr > 0.5, [ ]  Temp. Dialysis,  [ ]  Permanent dialysis Stroke: No, [ ]  Minor, [ ]  Major Return to OR: No  Reason for return to OR: [ ]  Bleeding, [ ]  Infection, [ ]  Thrombosis, [ ]  Revision  Discharge medications: Statin use:  yes ASA use:  yes Plavix use:  yes Beta blocker use: yes CCB use:  No ACEI use:   no ARB use:  yes Coumadin use: no

## 2021-07-21 NOTE — Discharge Instructions (Signed)
 [facility] [place]  Discharge instructions  Lower Extremity Bypass Surgery  Please refer to the following instruction for your post-procedure care. Your surgeon or physician assistant will discuss any changes with you.  Activity  You are encouraged to walk as much as you can. You can slowly return to normal activities during the month after your surgery. Avoid strenuous activity and heavy lifting until your doctor tells you it's OK. Avoid activities such as vacuuming or swinging a golf club. Do not drive until your doctor give the OK and you are no longer taking prescription pain medications. It is also normal to have difficulty with sleep habits, eating and bowel movement after surgery. These will go away with time.  Bathing/Showering  Shower daily after you go home. Do not soak in a bathtub, hot tub, or swim until the incision heals completely.  Incision Care  Clean your incision with mild soap and water. Shower every day. Pat the area dry with a clean towel. You do not need a bandage unless otherwise instructed. Do not apply any ointments or creams to your incision. If you have open wounds you will be instructed how to care for them or a visiting nurse may be arranged for you. If you have staples or sutures along your incision they will be removed at your post-op appointment. You may have skin glue on your incision. Do not peel it off. It will come off on its own in about one week.  Keep Pravena wound vac on your groin incision until it loses it seal in about 7-10 days.  Once that happens, you can remove and then wash the groin wound with soap and water daily and pat dry. (No tub bath-only shower)  Then put a dry gauze or washcloth in the groin to keep this area dry to help prevent wound infection.  Do this daily and as needed.  Do not use Vaseline or neosporin on your incisions.  Only use soap and water on your incisions and then protect and keep  dry.   Diet  Resume your normal diet. There are no special food restrictions following this procedure. A low fat/ low cholesterol diet is recommended for all patients with vascular disease. In order to heal from your surgery, it is CRITICAL to get adequate nutrition. Your body requires vitamins, minerals, and protein. Vegetables are the best source of vitamins and minerals. Vegetables also provide the perfect balance of protein. Processed food has little nutritional value, so try to avoid this.  Medications  Resume taking all your medications unless your doctor or physician assistant tells you not to. If your incision is causing pain, you may take over-the-counter pain relievers such as acetaminophen (Tylenol). If you were prescribed a stronger pain medication, please aware these medication can cause nausea and constipation. Prevent nausea by taking the medication with a snack or meal. Avoid constipation by drinking plenty of fluids and eating foods with high amount of fiber, such as fruits, vegetables, and grains. Take Colace 100 mg (an over-the-counter stool softener) twice a day as needed for constipation.  Do not take Tylenol if you are taking prescription pain medications.  Follow Up  Our office will schedule a follow up appointment 2-3 weeks following discharge.  Please call us immediately for any of the following conditions  Severe or worsening pain in your legs or feet while at rest or while walking Increase pain, redness, warmth, or drainage (pus) from your incision site(s) Fever of 101   degree or higher The swelling in your leg with the bypass suddenly worsens and becomes more painful than when you were in the hospital If you have been instructed to feel your graft pulse then you should do so every day. If you can no longer feel this pulse, call the office immediately. Not all patients are given this instruction.  Leg swelling is common after leg bypass surgery.  The swelling should  improve over a few months following surgery. To improve the swelling, you may elevate your legs above the level of your heart while you are sitting or resting. Your surgeon or physician assistant may ask you to apply an ACE wrap or wear compression (TED) stockings to help to reduce swelling.  Reduce your risk of vascular disease  Stop smoking. If you would like help call [facility] at 1-800-QUIT-NOW (1-800-784-8669) or [place] at 336-586-4000.  Manage your cholesterol Maintain a desired weight Control your diabetes weight Control your diabetes Keep your blood pressure down  If you have any questions, please call the office at [phone]  

## 2021-07-21 NOTE — TOC Transition Note (Signed)
Transition of Care [facility]) - CM/SW Discharge Note   Patient Details  Name: Ashley [name] MRN: [id number] Date of Birth: Oct 14, 1956  Transition of Care [facility]) CM/SW Contact:  Kallie Locks, RN Phone Number: [phone] 07/21/2021, 9:53 AM   Clinical Narrative:   Spoke with patient who is transitioning home today. Home health not needed. However, DME 3 in 1 and rolling walker recommended. [name] reports she lives with spouse and he will pick her up today. No further needs.  [facility] contacted for rolling walker and 3 in 1. Will be delivered to room before transition home.     Final next level of care: Home/Self Care Barriers to Discharge: No Barriers Identified   Patient Goals and CMS Choice Patient states their goals for this hospitalization and ongoing recovery are:: return home      Discharge Placement                  Discharge Plan and Services                DME Arranged: 3-N-1, Walker rolling DME Agency: [facility] Date DME Agency Contacted: 07/21/21 Time DME Agency Contacted: [phone] Representative spoke with at DME Agency: [facility] [facility]        Social Determinants of Health (SDOH) Interventions     Readmission Risk Interventions No flowsheet data found.

## 2021-07-21 NOTE — Progress Notes (Addendum)
  Progress Note    07/21/2021 5:34 AM 2 Days Post-Op  Subjective:  c/o pain; says her foot is still numb  Afebrile HR 70's-90's NSR 140's-150's systolic 98% RA  Vitals:   07/21/21 0018 07/21/21 0339  BP: (!) 145/57 (!) 142/64  Pulse: 83 97  Resp: 19 19  Temp: 98.3 F (36.8 C) 98.2 F (36.8 C)  SpO2: 96% 98%    Physical Exam: Cardiac:  regular Lungs:  non labored Incisions:  Prevena vac in place Extremities:  right DP pulse is palpable; right foot is warm   CBC    Component Value Date/Time   WBC 9.2 07/20/2021 0425   RBC 2.86 (L) 07/20/2021 0425   HGB 8.0 (L) 07/20/2021 0425   HCT 25.2 (L) 07/20/2021 0425   PLT 235 07/20/2021 0425   MCV 88.1 07/20/2021 0425   MCH 28.0 07/20/2021 0425   MCHC 31.7 07/20/2021 0425   RDW 15.6 (H) 07/20/2021 0425    BMET    Component Value Date/Time   NA 136 07/20/2021 0425   K 4.1 07/20/2021 0425   CL 99 07/20/2021 0425   CO2 25 07/20/2021 0425   GLUCOSE 320 (H) 07/20/2021 0425   BUN 6 (L) 07/20/2021 0425   CREATININE 0.56 07/20/2021 0425   CALCIUM 9.1 07/20/2021 0425   GFRNONAA >60 07/20/2021 0425    INR    Component Value Date/Time   INR 1.1 07/17/2021 1100     Intake/Output Summary (Last 24 hours) at 07/21/2021 0534 Last data filed at 07/20/2021 1300 Gross per 24 hour  Intake 360 ml  Output 800 ml  Net -440 ml     Assessment/Plan:  65 y.o. female is s/p:  1) right greater saphenous vein harvest 2) right iliofemoral endarterectomy and profundaplasty  2 Days Post-Op   -pt with palpable right DP pulse.  Again explained to to pt that it will take time to see what feeling she will get back in her foot.  -right groin with Prevena in place-will need to keep in place until it loses its seal.  -PT recommending no PT f/u but RW and 3in1.  TOC consulted and orders for DME placed.  -DVT prophylaxis:  sq heparin -PDMP reviewed and pt receives Tramadol 90 tablets monthly from her MD.  Discussed with pt that we will  be unable to prescribe any other pain medication due to this  Addendum-discussed with Dr. Chestine Spore and given pt has had surgery, will prescribe Percocet 5/325 one q6h prn pain #12 no refill for surgical pain.  She will need to contact her pain management MD for further refills.    Doreatha Massed, PA-C [facility] [phone] 07/21/2021 5:34 AM  I have seen and evaluated the patient. I agree with the PA note as documented above.  65 year old female status post right femoral endarterectomy with vein patch angioplasty.  Prevena VAC to right groin.  Brisk DP signal on the right foot.  Appropriate for discharge today.  We will give her a short amount of Percocet to get through the weekend until she can talk to her pain doctor.  Discussed she can take her Prevena VAC down if it loses seal or at least within the next week by next Friday.  Keep dry dressing in the groin.  We will arrange follow-up in our office in 2 to 3 weeks for incision check.  Cephus Shelling, MD [facility] [place] Office: [phone]

## 2021-08-06 ENCOUNTER — Telehealth: Payer: Self-pay

## 2021-08-06 NOTE — Telephone Encounter (Signed)
Patient's husband calls today to report that patient's right groin site is red, and is draining white pus. Doesn't know if pus has an odor. Says it has been a few days since patient has run a fever- patient is also covid +. Says the site is painful depending on movement. Placed patient on schedule at the end of the day for exam. Instructed to wait in car and call upon arrival - verbalized understanding.

## 2021-08-07 ENCOUNTER — Other Ambulatory Visit: Payer: Self-pay

## 2021-08-07 ENCOUNTER — Ambulatory Visit (INDEPENDENT_AMBULATORY_CARE_PROVIDER_SITE_OTHER): Payer: Self-pay | Admitting: Vascular Surgery

## 2021-08-07 ENCOUNTER — Encounter: Payer: Self-pay | Admitting: Vascular Surgery

## 2021-08-07 VITALS — BP 123/71 | HR 78 | Temp 98.2°F | Resp 20 | Ht 64.0 in | Wt 174.0 lb

## 2021-08-07 DIAGNOSIS — I739 Peripheral vascular disease, unspecified: Secondary | ICD-10-CM

## 2021-08-07 NOTE — Progress Notes (Signed)
[facility] [place] PROGRESS NOTE  ASSESSMENT / PLAN: Ashley [name] is a 65 y.o. female status post iliofemoral endarterectomy and saphenous vein patch angioplasty for ischemic rest pain of the right foot 07/19/21.  She reports good symptomatic relief of her right foot, but new drainage from the incision.  There is a small sinus (~11mm) that is draining serosanguinous fluid.  [name] evidence of infection.  I expect this to heal with meticulous wound care.  Instructed patient to return for any signs of infection.  We will see her again in 2 weeks to check on the groin.  Sooner if her wound deteriorates.  SUBJECTIVE: Reports new drainage from right groin over the past several days.  Right foot continues to improve daily.  OBJECTIVE: BP 123/71 (BP Location: Left Arm, Patient Position: Sitting, Cuff Size: Normal)   Pulse 78   Temp 98.2 F (36.8 C)   Resp 20   Ht 5\' 4"  (1.626 m)   Wt 174 lb (78.9 kg)   SpO2 95%   BMI 29.87 kg/m   [name] acute distress Right groin healed except for a small focus of dehiscence with serosanguineous drainage.  [name] evidence of cellulitis, induration, purulence.  CBC Latest Ref Rng & Units 07/20/2021 07/19/2021 07/17/2021  WBC 4.0 - 10.5 K/uL 9.2 7.8 7.8  Hemoglobin 12.0 - 15.0 g/dL 8.0(L) 8.7(L) 11.5(L)  Hematocrit 36.0 - 46.0 % 25.2(L) 28.2(L) 37.7  Platelets 150 - 400 K/uL 235 218 298     CMP Latest Ref Rng & Units 07/20/2021 07/19/2021 07/17/2021  Glucose 70 - 99 mg/dL 07/19/2021) - 696(V)  BUN 8 - 23 mg/dL 6(L) - 8  Creatinine 893(Y - 1.00 mg/dL 1.01 7.51 0.25  Sodium 135 - 145 mmol/L 136 - 138  Potassium 3.5 - 5.1 mmol/L 4.1 - 4.2  Chloride 98 - 111 mmol/L 99 - 101  CO2 22 - 32 mmol/L 25 - 27  Calcium 8.9 - 10.3 mg/dL 9.1 - 9.4  Total Protein 6.5 - 8.1 g/dL - - 7.6  Total Bilirubin 0.3 - 1.2 mg/dL - - 1.2  Alkaline Phos 38 - 126 U/L - - 62  AST 15 - 41 U/L - - 70(H)  ALT 0 - 44 U/L - - 40    Estimated Creatinine Clearance: 74.1 mL/min (by  C-G formula based on SCr of 0.56 mg/dL).  8.52. [name], MD [facility] Phone Number: [phone] 08/07/2021 4:49 PM

## 2021-08-09 ENCOUNTER — Other Ambulatory Visit: Payer: Self-pay

## 2021-08-09 DIAGNOSIS — I739 Peripheral vascular disease, unspecified: Secondary | ICD-10-CM

## 2021-08-21 ENCOUNTER — Encounter (HOSPITAL_COMMUNITY): Payer: Medicaid Other

## 2021-08-21 ENCOUNTER — Ambulatory Visit (INDEPENDENT_AMBULATORY_CARE_PROVIDER_SITE_OTHER): Payer: Self-pay | Admitting: Physician Assistant

## 2021-08-21 ENCOUNTER — Other Ambulatory Visit: Payer: Self-pay

## 2021-08-21 ENCOUNTER — Ambulatory Visit (HOSPITAL_COMMUNITY)
Admission: RE | Admit: 2021-08-21 | Discharge: 2021-08-21 | Disposition: A | Discharge status: Home / Self Care | Payer: Self-pay | Source: Ambulatory Visit | Attending: Vascular Surgery | Admitting: Vascular Surgery

## 2021-08-21 VITALS — BP 146/77 | HR 72 | Temp 97.7°F | Resp 20 | Ht 64.0 in | Wt 174.6 lb

## 2021-08-21 DIAGNOSIS — I739 Peripheral vascular disease, unspecified: Secondary | ICD-10-CM | POA: Insufficient documentation

## 2021-08-21 DIAGNOSIS — T8131XS Disruption of external operation (surgical) wound, not elsewhere classified, sequela: Secondary | ICD-10-CM

## 2021-08-21 NOTE — Progress Notes (Signed)
POST OPERATIVE OFFICE NOTE    CC:  F/u for surgery  HPI:  This is a 65 y.o. female who is s/p right greater saphenous vein harvest, and right iliofemoral endartectomy and profundoplasty by Dr. [name] on 07/19/21. This was performed due to ischemic rest pain in her right lower extremity. She tolerated the procedure well and was discharged post operative day 2. She was seen on 9/13 by Dr. [name], at which time she had a small area of dehiscence with drainage from her right groin incision.She was told to meticulously clean wound and observe it for signs of infection.   She reports today that since surgery remains without rest pain. She is still having some drainage from her right groin incision with small area still open. She says this morning she also had some bleeding from the opening which she has not previously had. She is not having any fever or chills. She says she has been cleaning it several times a day and keeping dry gauze in her groin. Otherwise she is having swelling in her right leg/foot on ambulation since the surgery. She does elevate it at night with some improvement. She is having difficulty wearing close toed shoes as a result. Otherwise no pain on ambulation. She is on statin and Asprin. She says she was instructed at her last visit to discontinue her Plavix and continue taking Aspirin.   Allergies  Allergen Reactions   Codeine     Upset stomach    Current Outpatient Medications  Medication Sig Dispense Refill   acetaminophen (TYLENOL) 500 MG tablet Take 1,000 mg by mouth every 6 (six) hours as needed for moderate pain or headache.     acyclovir (ZOVIRAX) 400 MG tablet Take 400 mg by mouth daily as needed (fever blisters).     ALPRAZolam (XANAX) 0.5 MG tablet Take 0.5 mg by mouth daily as needed for anxiety.     aspirin 325 MG tablet Take 325 mg by mouth daily.     atorvastatin (LIPITOR) 80 MG tablet Take 80 mg by mouth daily.     cyclobenzaprine (FLEXERIL) 5 MG tablet Take 5  mg by mouth at bedtime.     glipiZIDE (GLUCOTROL) 5 MG tablet Take 10 mg by mouth in the morning and at bedtime.     HUMULIN N 100 UNIT/ML injection Inject 60 Units into the skin daily.     hydrochlorothiazide (HYDRODIURIL) 25 MG tablet Take 12.5 mg by mouth daily.     linaclotide (LINZESS) 290 MCG CAPS capsule Take 290 mcg by mouth daily.     losartan (COZAAR) 100 MG tablet Take 100 mg by mouth daily.     metFORMIN (GLUCOPHAGE) 1000 MG tablet Take 1,000-1,500 mg by mouth See admin instructions. Take 1000 mg in the morning and 1500 mg at night     metoprolol tartrate (LOPRESSOR) 25 MG tablet Take 37.5 mg by mouth 2 (two) times daily.     OVER THE COUNTER MEDICATION Take 2 capsules by mouth daily. Keto otc supplement     oxyCODONE-acetaminophen (PERCOCET) 5-325 MG tablet Take 1 tablet by mouth every 6 (six) hours as needed for severe pain. 12 tablet 0   pantoprazole (PROTONIX) 40 MG tablet Take 40 mg by mouth daily.     potassium chloride (MICRO-K) 10 MEQ CR capsule Take 10 mEq by mouth daily.     Tetrahydrozoline HCl (VISINE OP) Place 1 drop into both eyes daily as needed (irritation).     traMADol (ULTRAM) 50 MG tablet Take  50 mg by mouth 3 (three) times daily as needed for pain.     TRULICITY 1.5 MG/0.5ML SOPN Inject 1.5 mg into the skin every Wednesday.     No current facility-administered medications for this visit.     ROS:  See HPI  Physical Exam:  Vitals:   08/21/21 1130  BP: (!) 146/77  Pulse: 72  Resp: 20  Temp: 97.7 F (36.5 C)  TempSrc: Temporal  SpO2: 96%  Weight: 174 lb 9.6 oz (79.2 kg)  Height: 5\' 4"  (1.626 m)   General: well appearing, well nourished,  Incision:  right groin incision with focal area of dehiscence in the mid aspect of incision. No signs of infection. No swelling, erythema or drainage. There is some fibrinous tissue in wound bed. Dry gauze reapplied   Extremities:  well perfused and warm. 2+ femoral pulses bilaterally, no palpable distal pulses.  Mild edema of right ankle Neuro: alert and oriented Abdomen:  obese, soft, non tender  Non-invasive vascular lab: 08/21/21    ABI Findings:  +---------+------------------+-----+----------+--------+  Right    Rt Pressure (mmHg)IndexWaveform  Comment   +---------+------------------+-----+----------+--------+  Brachial 167                                        +---------+------------------+-----+----------+--------+  PTA      94                0.55 monophasic          +---------+------------------+-----+----------+--------+  DP       94                0.55 monophasic          +---------+------------------+-----+----------+--------+  Great Toe68                0.40 Abnormal            +---------+------------------+-----+----------+--------+   +---------+------------------+-----+----------+-------+  Left     Lt Pressure (mmHg)IndexWaveform  Comment  +---------+------------------+-----+----------+-------+  Brachial 172                                       +---------+------------------+-----+----------+-------+  PTA      86                0.50 monophasic         +---------+------------------+-----+----------+-------+  DP       56                0.33 monophasic         +---------+------------------+-----+----------+-------+  Great Toe66                0.38 Abnormal           +---------+------------------+-----+----------+-------+   Assessment/Plan:  This is a 64 y.o. female who is s/p right greater saphenous vein harvest, and right iliofemoral endartectomy and profundoplasty by Dr. 68 on 07/19/21. Her ABI's today show monophasic flow bilaterally, with ABI of about 50% bilaterally. She has resolution of her rest pain and no claudication or tissue loss. She has small area of dehiscence in her groin wound. No signs of infection. Advised her to continue his current wound care and applying dry gauze. She will follow up sooner if she  has concerns about infection otherwise I will have her return in 3-4 weeks for  another incision check   Graceann Congress, PA-C [facility] [phone]  Clinic MD:  Steve Rattler

## 2021-09-18 ENCOUNTER — Ambulatory Visit (INDEPENDENT_AMBULATORY_CARE_PROVIDER_SITE_OTHER): Payer: Self-pay | Admitting: Physician Assistant

## 2021-09-18 ENCOUNTER — Encounter: Payer: Self-pay | Admitting: Physician Assistant

## 2021-09-18 ENCOUNTER — Other Ambulatory Visit: Payer: Self-pay

## 2021-09-18 VITALS — BP 138/71 | HR 81 | Temp 97.7°F | Resp 20 | Ht 64.0 in | Wt 171.0 lb

## 2021-09-18 DIAGNOSIS — I739 Peripheral vascular disease, unspecified: Secondary | ICD-10-CM

## 2021-09-18 NOTE — Progress Notes (Signed)
[facility] [place] HISTORY AND PHYSICAL   History of Present Illness:  Patient is a 65 y.o. year old female who presents for evaluation of claudication.  She has history of  ischemic rest pain of the right lower extremity.  She is now s/p  right iliofemoral endarterectomy and profundaplasty with vein patch angioplasty.    The groin incision was slow to heal with SS drainage.  She is here today for incisional check.  She denise claudication and rest pain symptoms.  She still has toe tip sensitivity to touch and sleeps without anything touching them.     Past Medical History:  Diagnosis Date   Anxiety    COVID    Diabetes mellitus without complication (HCC)    Hypertension    Peripheral vascular disease (HCC)     Past Surgical History:  Procedure Laterality Date   APPLICATION OF WOUND VAC Right 07/19/2021   Procedure: APPLICATION OF WOUND VAC;  Surgeon: Leonie Douglas, MD;  Location: [facility] OR;  Service: Vascular;  Laterality: Right;   CHOLECYSTECTOMY     ENDARTERECTOMY FEMORAL Right 07/19/2021   Procedure: RIGHT ILIO-FEMORAL ENDARTERECTOMY;  Surgeon: Leonie Douglas, MD;  Location: [facility] OR;  Service: Vascular;  Laterality: Right;   IR ANGIOGRAM EXTREMITY RIGHT  04/30/2021   IR ILIAC ART STENT INC PTA EA ADD IPSILAT MOD SED  04/30/2021   IR US GUIDE VASC ACCESS RIGHT  04/30/2021   IR US GUIDE VASC ACCESS RIGHT  04/30/2021   VEIN HARVEST Right 07/19/2021   Procedure: RIGHT SAPHENOUS VEIN HARVEST;  Surgeon: Leonie Douglas, MD;  Location: [facility] OR;  Service: Vascular;  Laterality: Right;    ROS:   General:  No weight loss, Fever, chills  HEENT: No recent headaches, no nasal bleeding, no visual changes, no sore throat  Neurologic: No dizziness, blackouts, seizures. No recent symptoms of stroke or mini- stroke. No recent episodes of slurred speech, or temporary blindness.  Cardiac: No recent episodes of chest pain/pressure, no shortness of breath at rest.  No  shortness of breath with exertion.  Denies history of atrial fibrillation or irregular heartbeat  Vascular: No history of rest pain in feet.  No history of claudication.  No history of non-healing ulcer, No history of DVT   Pulmonary: No home oxygen, no productive cough, no hemoptysis,  No asthma or wheezing  Musculoskeletal:  [ ]  Arthritis, [ ]  Low back pain,  [ ]  Joint pain  Hematologic:No history of hypercoagulable state.  No history of easy bleeding.  No history of anemia  Gastrointestinal: No hematochezia or melena,  No gastroesophageal reflux, no trouble swallowing  Urinary: [ ]  chronic Kidney disease, [ ]  on HD - [ ]  MWF or [ ]  TTHS, [ ]  Burning with urination, [ ]  Frequent urination, [ ]  Difficulty urinating;   Skin: No rashes  Psychological: No history of anxiety,  No history of depression  Social History Social History   Tobacco Use   Smoking status: Former    Packs/day: 1.50    Types: Cigarettes   Smokeless tobacco: Never  Vaping Use   Vaping Use: Never used  Substance Use Topics   Alcohol use: Not Currently   Drug use: Never    Family History No family history on file.  Allergies  Allergies  Allergen Reactions   Codeine     Upset stomach     Current Outpatient Medications  Medication Sig Dispense Refill   acetaminophen (TYLENOL) 500 MG tablet Take  1,000 mg by mouth every 6 (six) hours as needed for moderate pain or headache.     acyclovir (ZOVIRAX) 400 MG tablet Take 400 mg by mouth daily as needed (fever blisters).     ALPRAZolam (XANAX) 0.5 MG tablet Take 0.5 mg by mouth daily as needed for anxiety.     aspirin 325 MG tablet Take 325 mg by mouth daily.     atorvastatin (LIPITOR) 80 MG tablet Take 80 mg by mouth daily.     cyclobenzaprine (FLEXERIL) 5 MG tablet Take 5 mg by mouth at bedtime.     glipiZIDE (GLUCOTROL) 5 MG tablet Take 10 mg by mouth in the morning and at bedtime.     HUMULIN N 100 UNIT/ML injection Inject 60 Units into the skin daily.      hydrochlorothiazide (HYDRODIURIL) 25 MG tablet Take 12.5 mg by mouth daily.     linaclotide (LINZESS) 290 MCG CAPS capsule Take 290 mcg by mouth daily.     losartan (COZAAR) 100 MG tablet Take 100 mg by mouth daily.     metFORMIN (GLUCOPHAGE) 1000 MG tablet Take 1,000-1,500 mg by mouth See admin instructions. Take 1000 mg in the morning and 1500 mg at night     metoprolol tartrate (LOPRESSOR) 25 MG tablet Take 37.5 mg by mouth 2 (two) times daily.     OVER THE COUNTER MEDICATION Take 2 capsules by mouth daily. Keto otc supplement     oxyCODONE-acetaminophen (PERCOCET) 5-325 MG tablet Take 1 tablet by mouth every 6 (six) hours as needed for severe pain. 12 tablet 0   pantoprazole (PROTONIX) 40 MG tablet Take 40 mg by mouth daily.     potassium chloride (MICRO-K) 10 MEQ CR capsule Take 10 mEq by mouth daily.     Tetrahydrozoline HCl (VISINE OP) Place 1 drop into both eyes daily as needed (irritation).     traMADol (ULTRAM) 50 MG tablet Take 50 mg by mouth 3 (three) times daily as needed for pain.     TRULICITY 1.5 MG/0.5ML SOPN Inject 1.5 mg into the skin every Wednesday.     No current facility-administered medications for this visit.    Physical Examination  Vitals:   09/18/21 1514  BP: 138/71  Pulse: 81  Resp: 20  Temp: 97.7 F (36.5 C)  TempSrc: Temporal  SpO2: 95%  Weight: 171 lb (77.6 kg)  Height: 5\' 4"  (1.626 m)    Body mass index is 29.35 kg/m.        ASSESSMENT: 65 y.o. female status post iliofemoral endarterectomy and saphenous vein patch angioplasty for ischemic rest pain of the right foot 07/19/21.  The groin drainage is very minimal with a 0.5 cm opening that is superficial.     PLAN: Dry dressing over right groin incision until the incision is completely healed.  She will walk for exercise, elevate her legs 1-2 times a day for edema and f/u in 6 months for repeat ABI's.  If hse has problems or concerns she will call our office.  She is very pleased with her  outcome.     07/21/21 PA-C [facility] [place] Office: [phone]  MD in clinic [place]

## 2021-09-19 ENCOUNTER — Other Ambulatory Visit: Payer: Self-pay

## 2021-09-19 DIAGNOSIS — I739 Peripheral vascular disease, unspecified: Secondary | ICD-10-CM

## 2021-10-05 ENCOUNTER — Telehealth: Payer: Self-pay | Admitting: Oncology

## 2021-10-05 NOTE — Telephone Encounter (Signed)
Scheduled appt per 11/10 referral. Pt is aware of appt date and time.  

## 2021-10-25 ENCOUNTER — Other Ambulatory Visit: Payer: Self-pay | Admitting: Oncology

## 2021-10-25 DIAGNOSIS — D508 Other iron deficiency anemias: Secondary | ICD-10-CM

## 2021-10-25 NOTE — Progress Notes (Signed)
[facility]  [address] [place],  [place]  [address] [phone]  Clinic Day:  10/26/2021  Referring physician: Earlyne Iba, NP   HISTORY OF PRESENT ILLNESS:  The patient is a 65 y.o. female  who I was asked to consult upon for iron deficiency anemia.  Recent labs showed a low hemoglobin of 9.8, with a borderline low MCV of 81.  Iron studies done recently showed a borderline low serum iron of 27, a TIBC of 382, and a low iron saturation of 7%.  The patient claims she has never been told before of having anemia.  However, she claims to have had 2 EGDs earlier this year, including 1 which showed 10 bleeding ulcers.  The patient admits that she was taking 4-5 Goody's powders per day for numerous years, but stopped doing this in February 2021.  When a repeat EGD was done, all of her previous gastric ulcers had dissipated.  She currently takes 1 iron pill daily, which she has been doing for the past 2 weeks.  She admits to having ice cravings.  To her knowledge there is no family history of anemia or other hematologic disorders.   PAST MEDICAL HISTORY:   Past Medical History:  Diagnosis Date   Anxiety    COVID    Diabetes mellitus without complication ([place])    Hypertension    Peripheral vascular disease ([place])     PAST SURGICAL HISTORY:   Past Surgical History:  Procedure Laterality Date   APPLICATION OF WOUND VAC Right 07/19/2021   Procedure: APPLICATION OF WOUND VAC;  Surgeon: Cherre Robins, MD;  Location: [facility];  Service: Vascular;  Laterality: Right;   CHOLECYSTECTOMY     ENDARTERECTOMY FEMORAL Right 07/19/2021   Procedure: RIGHT ILIO-FEMORAL ENDARTERECTOMY;  Surgeon: Cherre Robins, MD;  Location: [facility];  Service: Vascular;  Laterality: Right;   IR ANGIOGRAM EXTREMITY RIGHT  04/30/2021   IR ILIAC ART STENT INC PTA EA ADD IPSILAT MOD SED  04/30/2021   IR US GUIDE VASC ACCESS RIGHT  04/30/2021   IR US GUIDE VASC ACCESS RIGHT  04/30/2021    VEIN HARVEST Right 07/19/2021   Procedure: RIGHT SAPHENOUS VEIN HARVEST;  Surgeon: Cherre Robins, MD;  Location: [facility];  Service: Vascular;  Laterality: Right;    CURRENT MEDICATIONS:   Current Outpatient Medications  Medication Sig Dispense Refill   acetaminophen (TYLENOL) 500 MG tablet Take 1,000 mg by mouth every 6 (six) hours as needed for moderate pain or headache.     acyclovir (ZOVIRAX) 400 MG tablet Take 400 mg by mouth daily as needed (fever blisters).     ALPRAZolam (XANAX) 0.5 MG tablet Take 0.5 mg by mouth daily as needed for anxiety.     aspirin 325 MG tablet Take 325 mg by mouth daily.     atorvastatin (LIPITOR) 80 MG tablet Take 80 mg by mouth daily.     cyclobenzaprine (FLEXERIL) 5 MG tablet Take 5 mg by mouth at bedtime.     glipiZIDE (GLUCOTROL) 5 MG tablet Take 10 mg by mouth in the morning and at bedtime.     HUMULIN N 100 UNIT/ML injection Inject 60 Units into the skin daily.     hydrochlorothiazide (HYDRODIURIL) 25 MG tablet Take 12.5 mg by mouth daily.     linaclotide (LINZESS) 290 MCG CAPS capsule Take 290 mcg by mouth daily.     losartan (COZAAR) 100 MG tablet Take 100 mg by mouth  daily.     metFORMIN (GLUCOPHAGE) 1000 MG tablet Take 1,000-1,500 mg by mouth See admin instructions. Take 1000 mg in the morning and 1500 mg at night     metoprolol tartrate (LOPRESSOR) 25 MG tablet Take 37.5 mg by mouth 2 (two) times daily.     OVER THE COUNTER MEDICATION Take 2 capsules by mouth daily. Keto otc supplement     oxyCODONE-acetaminophen (PERCOCET) 5-325 MG tablet Take 1 tablet by mouth every 6 (six) hours as needed for severe pain. 12 tablet 0   pantoprazole (PROTONIX) 40 MG tablet Take 40 mg by mouth daily.     potassium chloride (MICRO-K) 10 MEQ CR capsule Take 10 mEq by mouth daily.     Tetrahydrozoline HCl (VISINE OP) Place 1 drop into both eyes daily as needed (irritation).     traMADol (ULTRAM) 50 MG tablet Take 50 mg by mouth 3 (three) times daily as needed for  pain.     TRULICITY 1.5 MG/0.5ML SOPN Inject 1.5 mg into the skin every Wednesday.     No current facility-administered medications for this visit.    ALLERGIES:   Allergies  Allergen Reactions   Codeine     Upset stomach    FAMILY HISTORY:  No family history on file.  SOCIAL HISTORY:  The patient was born and raised in [place], [place].  She currently lives in [place] with her husband of 19 years.  She has 2 children and 6 grandchildren.  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  There is no history of alcoholism or tobacco abuse.  REVIEW OF SYSTEMS:  Review of Systems  Constitutional:  Positive for fatigue. Negative for fever.  HENT:   Negative for hearing loss and sore throat.   Eyes:  Negative for eye problems.  Respiratory:  Negative for chest tightness, cough and hemoptysis.   Cardiovascular:  Negative for chest pain and palpitations.  Gastrointestinal:  Negative for abdominal distention, abdominal pain, blood in stool, constipation, diarrhea, nausea and vomiting.  Endocrine: Negative for hot flashes.  Genitourinary:  Negative for difficulty urinating, dysuria, frequency, hematuria and nocturia.   Musculoskeletal:  Positive for arthralgias. Negative for back pain, gait problem and myalgias.  Skin: Negative.  Negative for itching and rash.  Neurological:  Positive for headaches. Negative for dizziness, extremity weakness, gait problem, light-headedness and numbness.  Hematological: Negative.   Psychiatric/Behavioral: Negative.  Negative for depression and suicidal ideas. The patient is not nervous/anxious.     PHYSICAL EXAM:  Blood pressure (!) 166/79, pulse 77, temperature 97.7 F (36.5 C), resp. rate 16, height 5\' 4"  (1.626 m), weight 166 lb 11.2 oz (75.6 kg), SpO2 96 %. Wt Readings from Last 3 Encounters:  10/26/21 166 lb 11.2 oz (75.6 kg)  09/18/21 171 lb (77.6 kg)  08/21/21 174 lb 9.6 oz (79.2 kg)   Body mass index is 28.61 kg/m. Performance status (ECOG): 0 -  Asymptomatic Physical Exam Constitutional:      Appearance: Normal appearance. She is not ill-appearing.  HENT:     Mouth/Throat:     Mouth: Mucous membranes are moist.     Pharynx: Oropharynx is clear. No oropharyngeal exudate or posterior oropharyngeal erythema.  Cardiovascular:     Rate and Rhythm: Normal rate and regular rhythm.     Heart sounds: No murmur heard.   No friction rub. No gallop.  Pulmonary:     Effort: Pulmonary effort is normal. No respiratory distress.     Breath sounds: Normal breath sounds.  No wheezing, rhonchi or rales.  Abdominal:     General: Bowel sounds are normal. There is no distension.     Palpations: Abdomen is soft. There is no mass.     Tenderness: There is no abdominal tenderness.  Musculoskeletal:        General: No swelling.     Right lower leg: No edema.     Left lower leg: No edema.  Lymphadenopathy:     Cervical: No cervical adenopathy.     Upper Body:     Right upper body: No supraclavicular or axillary adenopathy.     Left upper body: No supraclavicular or axillary adenopathy.     Lower Body: No right inguinal adenopathy. No left inguinal adenopathy.  Skin:    General: Skin is warm.     Coloration: Skin is not jaundiced.     Findings: No lesion or rash.  Neurological:     General: No focal deficit present.     Mental Status: She is alert and oriented to person, place, and time. Mental status is at baseline.  Psychiatric:        Mood and Affect: Mood normal.        Behavior: Behavior normal.        Thought Content: Thought content normal.    LABS:   CBC Latest Ref Rng & Units 10/26/2021 07/20/2021 07/19/2021  WBC - 7.6 9.2 7.8  Hemoglobin 12.0 - 16.0 10.4(A) 8.0(L) 8.7(L)  Hematocrit 36 - 46 33(A) 25.2(L) 28.2(L)  Platelets 150 - 399 289 235 218   CMP Latest Ref Rng & Units 10/26/2021 07/20/2021 07/19/2021  Glucose 70 - 99 mg/dL - 320(H) -  BUN 4 - 21 13 6(L) -  Creatinine 0.5 - 1.1 0.5 0.56 0.59  Sodium 137 - 147 140 136 -   Potassium 3.4 - 5.3 4.6 4.1 -  Chloride 99 - 108 104 99 -  CO2 13 - 22 25(A) 25 -  Calcium 8.7 - 10.7 9.7 9.1 -  Total Protein 6.5 - 8.1 g/dL - - -  Total Bilirubin 0.3 - 1.2 mg/dL - - -  Alkaline Phos 25 - 125 80 - -  AST 13 - 35 58(A) - -  ALT 7 - 35 32 - -    Latest Reference Range & Units 10/26/21 10:30  Iron 28 - 170 ug/dL 88  UIBC ug/dL 407  TIBC 250 - 450 ug/dL 495 (H)  Saturation Ratios 10.4 - 31.8 % 18  Ferritin 11 - 307 ng/mL 12  (H): Data is abnormally high   ASSESSMENT & PLAN:  A 65 y.o. female who I was asked to consult upon for iron deficiency anemia.  I will arrange for her to receive IV iron over these next few weeks to rapidly replenish her iron stores and normalize her hemoglobin.  I do not have a problem with her continuing to take an iron pill on a daily basis.  I will see her back in 3 months to reassess her iron and hemoglobin levels to see how well she responded to her upcoming IV iron.  The patient understands all the plans discussed today and is in agreement with them.  I do appreciate Earlyne Iba, NP for his new consult.   Lynnel Zanetti Macarthur Critchley, MD

## 2021-10-26 ENCOUNTER — Other Ambulatory Visit: Payer: Self-pay

## 2021-10-26 ENCOUNTER — Inpatient Hospital Stay: Payer: Self-pay | Attending: Oncology | Admitting: Oncology

## 2021-10-26 ENCOUNTER — Inpatient Hospital Stay: Payer: Self-pay

## 2021-10-26 ENCOUNTER — Other Ambulatory Visit: Payer: Self-pay | Admitting: Hematology and Oncology

## 2021-10-26 DIAGNOSIS — R519 Headache, unspecified: Secondary | ICD-10-CM | POA: Insufficient documentation

## 2021-10-26 DIAGNOSIS — D500 Iron deficiency anemia secondary to blood loss (chronic): Secondary | ICD-10-CM | POA: Insufficient documentation

## 2021-10-26 DIAGNOSIS — R5383 Other fatigue: Secondary | ICD-10-CM | POA: Insufficient documentation

## 2021-10-26 DIAGNOSIS — Z79899 Other long term (current) drug therapy: Secondary | ICD-10-CM | POA: Insufficient documentation

## 2021-10-26 DIAGNOSIS — D508 Other iron deficiency anemias: Secondary | ICD-10-CM

## 2021-10-26 DIAGNOSIS — I739 Peripheral vascular disease, unspecified: Secondary | ICD-10-CM | POA: Insufficient documentation

## 2021-10-26 DIAGNOSIS — M2550 Pain in unspecified joint: Secondary | ICD-10-CM | POA: Insufficient documentation

## 2021-10-26 DIAGNOSIS — D509 Iron deficiency anemia, unspecified: Secondary | ICD-10-CM | POA: Insufficient documentation

## 2021-10-26 LAB — FERRITIN: Ferritin: 12 ng/mL (ref 11–307)

## 2021-10-26 LAB — HEPATIC FUNCTION PANEL
ALT: 32 (ref 7–35)
AST: 58 — AB (ref 13–35)
Alkaline Phosphatase: 80 (ref 25–125)
Bilirubin, Total: 0.7

## 2021-10-26 LAB — CBC: RBC: 4.21 (ref 3.87–5.11)

## 2021-10-26 LAB — BASIC METABOLIC PANEL
BUN: 13 (ref 4–21)
CO2: 25 — AB (ref 13–22)
Chloride: 104 (ref 99–108)
Creatinine: 0.5 (ref 0.5–1.1)
Glucose: 115
Potassium: 4.6 (ref 3.4–5.3)
Sodium: 140 (ref 137–147)

## 2021-10-26 LAB — IRON AND TIBC
Iron: 88 ug/dL (ref 28–170)
Saturation Ratios: 18 % (ref 10.4–31.8)
TIBC: 495 ug/dL — ABNORMAL HIGH (ref 250–450)
UIBC: 407 ug/dL

## 2021-10-26 LAB — CBC AND DIFFERENTIAL
HCT: 33 — AB (ref 36–46)
Hemoglobin: 10.4 — AB (ref 12.0–16.0)
Neutrophils Absolute: 3.5
Platelets: 289 (ref 150–399)
WBC: 7.6

## 2021-10-26 LAB — COMPREHENSIVE METABOLIC PANEL
Albumin: 4.7 (ref 3.5–5.0)
Calcium: 9.7 (ref 8.7–10.7)

## 2021-10-26 NOTE — Progress Notes (Signed)
Called and spoke with patient about applying for free Feraheme. She stated that she would come by Monday 12/05 to sign.

## 2021-10-30 NOTE — Progress Notes (Signed)
Sent in application to [employer] for [employer].

## 2021-10-31 ENCOUNTER — Encounter: Payer: Self-pay | Admitting: Oncology

## 2021-11-01 ENCOUNTER — Encounter: Payer: Self-pay | Admitting: Oncology

## 2021-11-01 NOTE — Addendum Note (Signed)
Addended by: Domenic Schwab on: 11/01/2021 02:04 PM   Modules accepted: Orders

## 2021-11-02 ENCOUNTER — Telehealth: Payer: Self-pay | Admitting: Oncology

## 2021-11-02 ENCOUNTER — Other Ambulatory Visit: Payer: Self-pay | Admitting: Pharmacist

## 2021-11-02 NOTE — Progress Notes (Signed)
The following Medication: Ashley [name] has been approved thru [employer]. Approved for 2 doses on 11/01/2021. Effective dates 11/01/2021 to 11/01/2022. Assistance ID: [id number] . Medication is ordered as Assistance to have on hand prior to treatment. First DOS: 11/09/2021    Next DOS: 11/15/2021 **additional doses, require new script sent to [facility].

## 2021-11-02 NOTE — Telephone Encounter (Signed)
Patient notified of scheduled Feraheme Appt's 12/16, 12/22

## 2021-11-07 ENCOUNTER — Telehealth: Payer: Self-pay | Admitting: Oncology

## 2021-11-07 NOTE — Telephone Encounter (Signed)
Per 12/2 LOS, patient scheduled for March 2023 Appt's.  Gave patient Appt Summary  Patient also scheduled for 12/16, 12/22 Feraheme Treatments

## 2021-11-08 MED FILL — Ferumoxytol Inj 510 MG/17ML (30 MG/ML) (Elemental Fe): INTRAVENOUS | Qty: 17 | Status: AC

## 2021-11-09 ENCOUNTER — Inpatient Hospital Stay: Payer: Self-pay

## 2021-11-09 ENCOUNTER — Other Ambulatory Visit: Payer: Self-pay

## 2021-11-09 VITALS — BP 113/61 | HR 67 | Temp 97.7°F | Resp 16 | Wt 169.0 lb

## 2021-11-09 DIAGNOSIS — D500 Iron deficiency anemia secondary to blood loss (chronic): Secondary | ICD-10-CM

## 2021-11-09 MED ORDER — SODIUM CHLORIDE 0.9 % IV SOLN: Freq: Once | INTRAVENOUS | Status: AC

## 2021-11-09 MED ORDER — SODIUM CHLORIDE 0.9 % IV SOLN
510.0000 mg | Freq: Once | INTRAVENOUS | Status: AC
  Administered 2021-11-09: 510 mg via INTRAVENOUS
  Filled 2021-11-09: qty 17

## 2021-11-09 NOTE — Patient Instructions (Signed)

## 2021-11-15 ENCOUNTER — Other Ambulatory Visit: Payer: Self-pay

## 2021-11-15 ENCOUNTER — Inpatient Hospital Stay: Payer: Self-pay

## 2021-11-15 VITALS — BP 126/64 | HR 78 | Temp 98.7°F | Resp 18 | Ht 64.0 in | Wt 170.0 lb

## 2021-11-15 DIAGNOSIS — D500 Iron deficiency anemia secondary to blood loss (chronic): Secondary | ICD-10-CM

## 2021-11-15 MED ORDER — SODIUM CHLORIDE 0.9 % IV SOLN: Freq: Once | INTRAVENOUS | Status: AC

## 2021-11-15 MED ORDER — SODIUM CHLORIDE 0.9 % IV SOLN
510.0000 mg | Freq: Once | INTRAVENOUS | Status: AC
  Administered 2021-11-15: 510 mg via INTRAVENOUS
  Filled 2021-11-15: qty 17

## 2021-11-17 IMAGING — US IR ANGIO/EXT/UNI*R*
1 series · 1 of 1 positions shown · non-contrast
Comparison: none

INDICATION: 64-year-old female with a history of peripheral arterial disease,
complex aorta iliac disease with TASC D pattern including left-sided
CIA and EIA occlusion, right-sided CIA high-grade stenosis, with
bilateral femoropopliteal disease including a right-sided chronic
total occlusion of the SFA.

[Series 1: ir angio/ext/uni*right* · 1 of 1 slices shown]
[im 1/1]
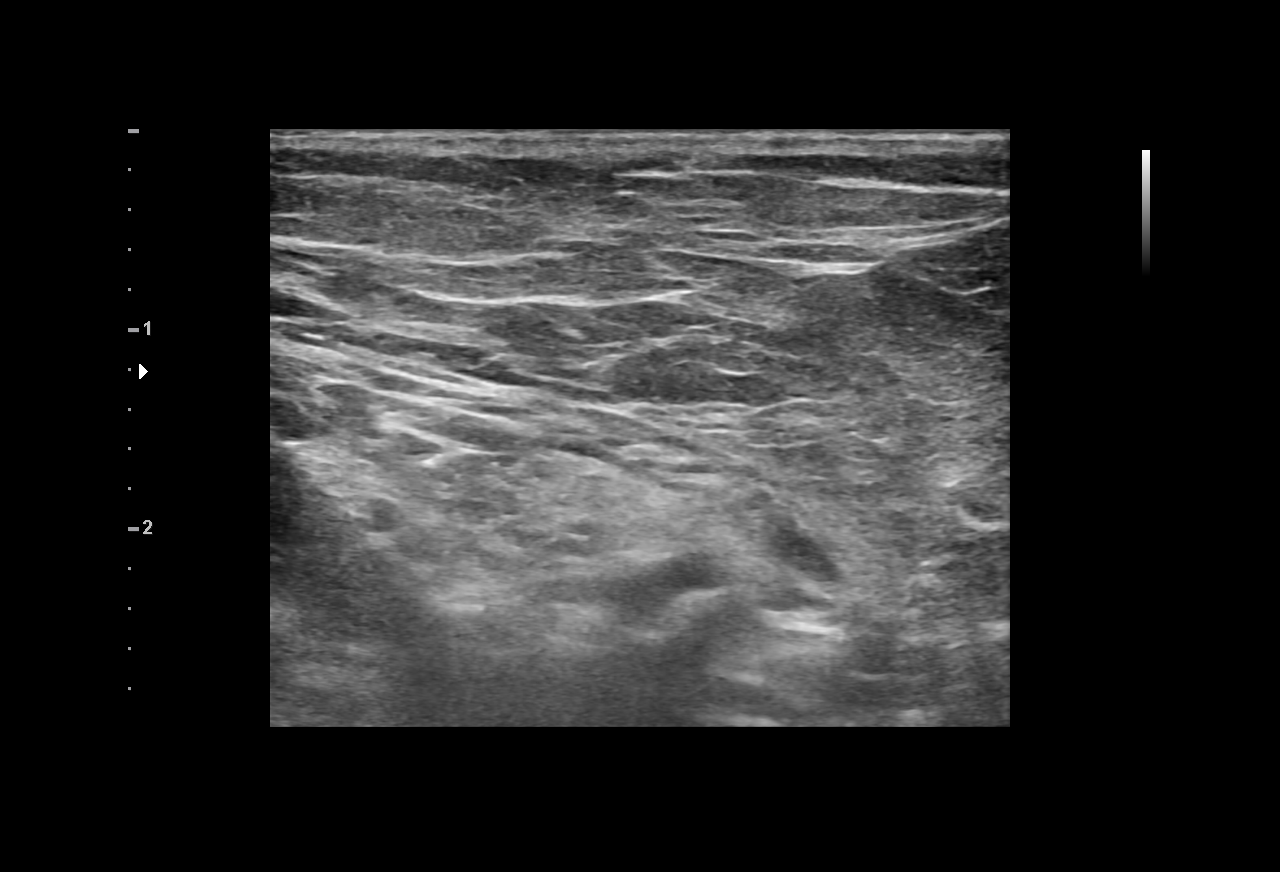

[1 of 1 positions shown; findings below may reference images not displayed]

EXAM:
ULTRASOUND-GUIDED ACCESS RIGHT COMMON FEMORAL ARTERY

ULTRASOUND GUIDED ACCESS RIGHT DORSALIS PEDIS ARTERY

ANGIOGRAM RIGHT LOWER EXTREMITY ANGIOGRAM

ARTERIAL PRESSURE MEASUREMENT AT COMMON ILIAC ARTERY ORIGIN

BALLOON MOUNTED STENTING OF RIGHT COMMON ILIAC ARTERY STENOSIS

FAILED ATTEMPT AT RETROGRADE SFA TREATMENT

ANGIO-SEAL DEPLOYMENT FOR HEMOSTASIS AT THE RIGHT COMMON FEMORAL
ARTERY

MEDICATIONS:
3999 units IV heparin.

40 mg IV protamine sulfate

ANESTHESIA/SEDATION:
Moderate (conscious) sedation was employed during this procedure. A
total of Versed 4.0 mg and Fentanyl 150 mcg was administered
intravenously.

Moderate Sedation Time: 96 minutes. The patient's level of
consciousness and vital signs were monitored continuously by
radiology nursing throughout the procedure under my direct
supervision.

CONTRAST:  30mL VISIPAQUE IODIXANOL 320 MG/ML IV SOLN, 25mL
VISIPAQUE IODIXANOL 320 MG/ML IV SOLN

FLUOROSCOPY TIME:  Fluoroscopy Time: 15 minutes 42 seconds (401
mGy).

COMPLICATIONS:
None



Ultrasound survey of the right inguinal region was performed with
images stored and sent to PACs, confirming patency of the vessel.

1% lidocaine was used for local anesthesia. Small stab incision was
made with 11 blade scalpel. Blunt dissection was performed. A
micropuncture needle was used access the right common femoral artery
under ultrasound. With excellent arterial blood flow returned, and
an .018 micro wire was passed through the needle, observed enter the
abdominal aorta under fluoroscopy. The needle was removed, and a
micropuncture sheath was placed over the wire. The inner dilator and
wire were removed, and an 035 Tolfsson wire was advanced under
fluoroscopy into the abdominal aorta. The sheath was removed and a
standard 5 French vascular sheath was placed. The dilator was
removed and the sheath was flushed.

Omni Flush catheter was then advanced into the lower abdominal aorta
for angiogram. 5888 units IV heparin was administered given the slow
flow through the common femoral artery.

Pressure measurement was obtained in the lower abdominal aorta and
recorded. The Omni Flush catheter was then removed on the 035
diagnostic wire and a pressure measurement was obtained from the
common femoral artery sheath and recorded.

Measurements were made of the anticipated treatment site of the
right common iliac artery stenosis. We elected to use a balloon
mounted Evodio VBX 6 mm x 19 mm covered stent.

Omni Flush catheter was then advanced again into the lower abdominal
aorta and a rosen wire was placed. Flush catheter was removed,
sheath was upsized to a 7 French sheath.

We then deployed the balloon mounted stent at the common iliac
artery. 15 atmospheres was observed on dilation. Patient remained
comfortable throughout.

Angiogram was then performed of the lower abdominal aorta confirming
excellent flow through the stented segment. Repeat pressure
measurement was obtained at the common femoral artery sheath,
confirming resolution of the previously observed gradient.

Given there was slow flow through the common femoral artery, we did
not want to proceed with attempt at treating the common femoral
artery until removal of the sheath.

Six French Angio-Seal was deployed for hemostasis without
difficulty.

Ultrasound-guided access of the right dorsalis pedis was then
performed with placement of the micro puncture sheath. Once the
micro puncture sheath was in position the wire was exchanged for a
V18 wire. The V18 wire was passed easily through the anterior tibial
artery to the distal superficial femoral artery. The micro puncture
was then removed and a 18 quick cross catheter was passed retrograde
through the puncture site. Combination of both the V18 wire and the
quick cross catheter encountered a subintimal position within the
mid segment of the SFA.

The crossing catheter was withdrawn to the distal SFA. A combination
of 014 whisper wire and the V18 wire with the quick cross catheter
were was used in attempt to gain a different channel through the
occluded SFA segment, given that we were attempting to avoid a
subintimal position in the proximal SFA. A 4 mm balloon angioplasty
was performed in the distal SFA, with attempt to hree center the tip
of our guidewire.

Having failed to achieve an adequate position of the crossing wire
in the mid and proximal SFA, we elected to withdraw all catheters
and wires.

All catheters wires were removed and manual pressure was used at the
dorsalis pedis puncture site.

Patient tolerated the procedure well and remained hemodynamically
stable throughout.

No complications were encountered and no significant blood loss.
FINDINGS: Ultrasound of the right common femoral artery demonstrates dense
calcifications with patency maintained. Small caliber right common
femoral artery.

Angiogram of the lower aorta demonstrates large L5 segmental
arteries contributing to bilateral collateral perfusion of the lower
extremities. The more robust collateral perfusion is on the left,
with the lateral circumflex iliac artery perfusing the common
femoral artery with flow into the left SFA and profunda femoris on
the late arterial images.

On the right there is a high-grade stenosis of the right CIA origin.
Small caliber right EIA.

There is slow flow through the common femoral artery around the
sheath, into the profunda femoris which remains patent. There is
adequate flow into a separate origin of the right circumflex femoral
artery.

Pressure measurements

Aorta: 162/72 (106)

Initial CFA: 118/69 (88), gradient of 44 systolic

Completion CFA: 158/64 (93), gradient of 4 systolic

Resolution of stenosis after stenting by angiogram

Ultrasound of the dorsalis pedis demonstrates calcifications with
patency of the artery. During the retrograde attempt at crossing the
SFA CT0, subintimal position was encountered.
IMPRESSION: Status post ultrasound-guided right CFA access with balloon mounted
stenting of critical right CIA stenosis, as well as a failed attempt
at retrograde right SFA chronic total occlusion crossing via
ultrasound-guided access right dorsalis pedis.

## 2022-01-18 NOTE — Progress Notes (Signed)
? ?[facility]  ?[address] ?[place],  [place]  [address] ?[phone] ? ?Clinic Day:  01/24/2022 ? ?Referring physician: Earlyne Iba, NP ? ?This document serves as a record of services personally performed by Marice Potter, MD. It was created on their behalf by Curry,Lauren E, a trained medical scribe. The creation of this record is based on the scribe's personal observations and the provider's statements to them. ? ?HISTORY OF PRESENT ILLNESS:  ?The patient is a 66 y.o. female  who I recently began seeing for iron deficiency anemia.  She comes in today to reassess her iron and hemoglobin levels after recently receiving IV iron.  Overall, the patient claims to be doing okay.  She initially felt better after she received her IV iron.  She denies having increased fatigue or any overt forms of blood loss which concern her for progressive anemia.  ? ?PHYSICAL EXAM:  ?Blood pressure (!) 193/81, pulse 63, temperature 97.6 ?F (36.4 ?C), resp. rate 16, height 5\' 4"  (1.626 m), weight 169 lb 14.4 oz (77.1 kg), SpO2 95 %. ?Wt Readings from Last 3 Encounters:  ?01/24/22 169 lb 14.4 oz (77.1 kg)  ?11/15/21 170 lb (77.1 kg)  ?11/09/21 169 lb (76.7 kg)  ? ?Body mass index is 29.16 kg/m?Marland Kitchen ?Performance status (ECOG): 0 - Asymptomatic ?Physical Exam ?Constitutional:   ?   Appearance: Normal appearance. She is not ill-appearing.  ?HENT:  ?   Mouth/Throat:  ?   Mouth: Mucous membranes are moist.  ?   Pharynx: Oropharynx is clear. No oropharyngeal exudate or posterior oropharyngeal erythema.  ?Cardiovascular:  ?   Rate and Rhythm: Normal rate and regular rhythm.  ?   Heart sounds: No murmur heard. ?  No friction rub. No gallop.  ?Pulmonary:  ?   Effort: Pulmonary effort is normal. No respiratory distress.  ?   Breath sounds: Normal breath sounds. No wheezing, rhonchi or rales.  ?Abdominal:  ?   General: Bowel sounds are normal. There is no distension.  ?   Palpations: Abdomen is soft. There is  no mass.  ?   Tenderness: There is no abdominal tenderness.  ?Musculoskeletal:     ?   General: No swelling.  ?   Right lower leg: No edema.  ?   Left lower leg: No edema.  ?Lymphadenopathy:  ?   Cervical: No cervical adenopathy.  ?   Upper Body:  ?   Right upper body: No supraclavicular or axillary adenopathy.  ?   Left upper body: No supraclavicular or axillary adenopathy.  ?   Lower Body: No right inguinal adenopathy. No left inguinal adenopathy.  ?Skin: ?   General: Skin is warm.  ?   Coloration: Skin is not jaundiced.  ?   Findings: No lesion or rash.  ?Neurological:  ?   General: No focal deficit present.  ?   Mental Status: She is alert and oriented to person, place, and time. Mental status is at baseline.  ?Psychiatric:     ?   Mood and Affect: Mood normal.     ?   Behavior: Behavior normal.     ?   Thought Content: Thought content normal.  ? ? ?LABS:  ? ?CBC Latest Ref Rng & Units 01/24/2022 10/26/2021 07/20/2021  ?WBC - 6.7 7.6 9.2  ?Hemoglobin 12.0 - 16.0 11.9(A) 10.4(A) 8.0(L)  ?Hematocrit 36 - 46 36 33(A) 25.2(L)  ?Platelets 150 - 399 234 289 235  ? ?CMP Latest  Ref Rng & Units 10/26/2021 07/20/2021 07/19/2021  ?Glucose 70 - 99 mg/dL - 320(H) -  ?BUN 4 - 21 13 6(L) -  ?Creatinine 0.5 - 1.1 0.5 0.56 0.59  ?Sodium 137 - 147 140 136 -  ?Potassium 3.4 - 5.3 4.6 4.1 -  ?Chloride 99 - 108 104 99 -  ?CO2 13 - 22 25(A) 25 -  ?Calcium 8.7 - 10.7 9.7 9.1 -  ?Total Protein 6.5 - 8.1 g/dL - - -  ?Total Bilirubin 0.3 - 1.2 mg/dL - - -  ?Alkaline Phos 25 - 125 80 - -  ?AST 13 - 35 58(A) - -  ?ALT 7 - 35 32 - -  ? ? Latest Reference Range & Units 10/26/21 10:30 01/24/22 10:42  ?Iron 28 - 170 ug/dL 88 85  ?UIBC ug/dL 407 246  ?TIBC 250 - 450 ug/dL 495 (H) 331  ?Saturation Ratios 10.4 - 31.8 % 18 26  ?Ferritin 11 - 307 ng/mL 12 161  ?(H): Data is abnormally high ? ? ?ASSESSMENT & PLAN:  ?A 66 y.o. female with iron deficiency anemia.  I am pleased as her hemoglobin has improved since receiving her IV iron.  Furthermore, her iron  parameters have clearly gotten better since receiving her IV iron.  Clinically, she appears to be doing okay.  As that is the case, I will see her back in 4 months for repeat clinical assessment.  The patient understands all the plans discussed today and is in agreement with them. ? ? ?I, Rita Ohara, am acting as scribe for Marice Potter, MD   ? ?I have reviewed this report as typed by the medical scribe, and it is complete and accurate. ? ?Cherae Marton Macarthur Critchley, MD ? ? ? ?  ? ?

## 2022-01-24 ENCOUNTER — Encounter: Payer: Self-pay | Admitting: Oncology

## 2022-01-24 ENCOUNTER — Inpatient Hospital Stay: Payer: Self-pay | Attending: Oncology | Admitting: Oncology

## 2022-01-24 ENCOUNTER — Other Ambulatory Visit: Payer: Self-pay | Admitting: Oncology

## 2022-01-24 ENCOUNTER — Other Ambulatory Visit: Payer: Self-pay

## 2022-01-24 ENCOUNTER — Telehealth: Payer: Self-pay | Admitting: Oncology

## 2022-01-24 ENCOUNTER — Inpatient Hospital Stay: Payer: Self-pay

## 2022-01-24 VITALS — BP 193/81 | HR 63 | Temp 97.6°F | Resp 16 | Ht 64.0 in | Wt 169.9 lb

## 2022-01-24 DIAGNOSIS — D500 Iron deficiency anemia secondary to blood loss (chronic): Secondary | ICD-10-CM

## 2022-01-24 DIAGNOSIS — D509 Iron deficiency anemia, unspecified: Secondary | ICD-10-CM | POA: Insufficient documentation

## 2022-01-24 LAB — CBC AND DIFFERENTIAL
HCT: 36 (ref 36–46)
Hemoglobin: 11.9 — AB (ref 12.0–16.0)
Neutrophils Absolute: 3.08
Platelets: 234 (ref 150–399)
WBC: 6.7

## 2022-01-24 LAB — FERRITIN: Ferritin: 161 ng/mL (ref 11–307)

## 2022-01-24 LAB — IRON AND TIBC
Iron: 85 ug/dL (ref 28–170)
Saturation Ratios: 26 % (ref 10.4–31.8)
TIBC: 331 ug/dL (ref 250–450)
UIBC: 246 ug/dL

## 2022-01-24 LAB — CBC: RBC: 4.06 (ref 3.87–5.11)

## 2022-01-24 NOTE — Telephone Encounter (Signed)
Per 01/24/22 los next appt scheduled and confirmed with patient ?

## 2022-01-25 ENCOUNTER — Telehealth: Payer: Self-pay

## 2022-01-25 NOTE — Telephone Encounter (Signed)
Pt notified of Dr Bobby Rumpf' response below. She verbalized understanding. ? ?LABS:  ?  ?CBC Latest Ref Rng & Units 01/24/2022 10/26/2021 07/20/2021  ?WBC - 6.7 7.6 9.2  ?Hemoglobin 12.0 - 16.0 11.9(A) 10.4(A) 8.0(L)  ?Hematocrit 36 - 46 36 33(A) 25.2(L)  ?Platelets 150 - 399 234 289 235  ?  ?CMP Latest Ref Rng & Units 10/26/2021 07/20/2021 07/19/2021  ?Glucose 70 - 99 mg/dL - 320(H) -  ?BUN 4 - 21 13 6(L) -  ?Creatinine 0.5 - 1.1 0.5 0.56 0.59  ?Sodium 137 - 147 140 136 -  ?Potassium 3.4 - 5.3 4.6 4.1 -  ?Chloride 99 - 108 104 99 -  ?CO2 13 - 22 25(A) 25 -  ?Calcium 8.7 - 10.7 9.7 9.1 -  ?Total Protein 6.5 - 8.1 g/dL - - -  ?Total Bilirubin 0.3 - 1.2 mg/dL - - -  ?Alkaline Phos 25 - 125 80 - -  ?AST 13 - 35 58(A) - -  ?ALT 7 - 35 32 - -  ?  ?  Latest Reference Range & Units 10/26/21 10:30 01/24/22 10:42  ?Iron 28 - 170 ug/dL 88 85  ?UIBC ug/dL 407 246  ?TIBC 250 - 450 ug/dL 495 (H) 331  ?Saturation Ratios 10.4 - 31.8 % 18 26  ?Ferritin 11 - 307 ng/mL 12 161  ?(H): Data is abnormally high ?  ?  ?ASSESSMENT & PLAN:  ?A 66 y.o. female with iron deficiency anemia.  I am pleased as her hemoglobin has improved since receiving her IV iron.  Furthermore, her iron parameters have clearly gotten better since receiving her IV iron.  Clinically, she appears to be doing okay.  As that is the case, I will see her back in 4 months for repeat clinical assessment.  The patient understands all the plans discussed today and is in agreement with them. ?  ?  ?I, Rita Ohara, am acting as scribe for Marice Potter, MD   ?  ?I have reviewed this report as typed by the medical scribe, and it is complete and accurate. ?  ?

## 2022-03-28 ENCOUNTER — Ambulatory Visit: Payer: Medicaid Other

## 2022-03-28 ENCOUNTER — Encounter (HOSPITAL_COMMUNITY): Payer: Medicaid Other

## 2022-04-16 ENCOUNTER — Encounter: Payer: Self-pay | Admitting: Oncology

## 2022-04-22 NOTE — Progress Notes (Unsigned)
VASC[facility]IN SPECIALISTS OF [place] PROGRESS NOTE  ASSESSMENT / PLAN: Ashley [name] is a 66 y.o. female status post iliofemoral endarterectomy and saphenous vein patch angioplasty for ischemic rest pain of the right foot 07/19/21.  She reports good symptomatic relief of her right foot, but new drainage from the incision.  There is a small sinus (~65mm) that is draining serosanguinous fluid.  No evidence of infection.  I expect this to heal with meticulous wound care.  Instructed patient to return for any signs of infection.  We will see her again in 2 weeks to check on the groin.  Sooner if her wound deteriorates.  SUBJECTIVE: Reports new drainage from right groin over the past several days.  Right foot continues to improve daily.  OBJECTIVE: There were no vitals taken for this visit.  No acute distress Right groin healed except for a small focus of dehiscence with serosanguineous drainage.  No evidence of cellulitis, induration, purulence.     Latest Ref Rng & Units 01/24/2022   12:00 AM 10/26/2021   12:00 AM 07/20/2021    4:25 AM  CBC  WBC  6.7   7.6      9.2    Hemoglobin 12.0 - 16.0 11.9   10.4      8.0    Hematocrit 36 - 46 36   33      25.2    Platelets 150 - 399 234   289      235       This result is from an external source.        Latest Ref Rng & Units 10/26/2021   12:00 AM 07/20/2021    4:25 AM 07/19/2021    6:02 PM  CMP  Glucose 70 - 99 mg/dL  732     BUN 4 - 21 13      6      Creatinine 0.5 - 1.1 0.5      0.56   0.59    Sodium 137 - 147 140      136     Potassium 3.4 - 5.3 4.6      4.1     Chloride 99 - 108 104      99     CO2 13 - 22 25      25      Calcium 8.7 - 10.7 9.7      9.1     Alkaline Phos 25 - 125 80         AST 13 - 35 58         ALT 7 - 35 32            This result is from an external source.    CrCl cannot be calculated (Patient's most recent lab result is older than the maximum 21 days allowed.).  . , MD [facility]ascular [facility] Vein  Specialists of [facility] Phone Number: [phone] 04/22/2022 12:25 PM

## 2022-04-23 ENCOUNTER — Encounter: Payer: Self-pay | Admitting: Vascular Surgery

## 2022-04-23 ENCOUNTER — Ambulatory Visit (INDEPENDENT_AMBULATORY_CARE_PROVIDER_SITE_OTHER): Payer: 59 | Admitting: Vascular Surgery

## 2022-04-23 ENCOUNTER — Encounter: Payer: Self-pay | Admitting: Oncology

## 2022-04-23 ENCOUNTER — Ambulatory Visit (HOSPITAL_COMMUNITY)
Admission: RE | Admit: 2022-04-23 | Discharge: 2022-04-23 | Disposition: A | Discharge status: Home / Self Care | Payer: 59 | Source: Ambulatory Visit | Attending: Physician Assistant | Admitting: Physician Assistant

## 2022-04-23 VITALS — BP 142/73 | HR 67 | Temp 98.3°F | Resp 20 | Ht 64.0 in | Wt 172.0 lb

## 2022-04-23 DIAGNOSIS — I739 Peripheral vascular disease, unspecified: Secondary | ICD-10-CM | POA: Diagnosis not present

## 2022-05-29 ENCOUNTER — Encounter: Payer: Self-pay | Admitting: Oncology

## 2022-06-05 ENCOUNTER — Inpatient Hospital Stay: Payer: Medicaid Other | Attending: Nurse Practitioner

## 2022-06-05 ENCOUNTER — Ambulatory Visit: Payer: Medicaid Other | Admitting: Oncology

## 2022-08-01 ENCOUNTER — Encounter: Payer: Self-pay | Admitting: Oncology

## 2022-08-01 ENCOUNTER — Other Ambulatory Visit: Payer: Self-pay | Admitting: Pharmacist

## 2023-05-05 ENCOUNTER — Other Ambulatory Visit: Payer: Self-pay | Admitting: *Deleted

## 2023-05-05 DIAGNOSIS — I739 Peripheral vascular disease, unspecified: Secondary | ICD-10-CM

## 2023-05-13 ENCOUNTER — Ambulatory Visit (HOSPITAL_COMMUNITY)
Admission: RE | Admit: 2023-05-13 | Discharge: 2023-05-13 | Disposition: A | Discharge status: Home / Self Care | Payer: Medicaid Other | Source: Ambulatory Visit | Attending: Vascular Surgery | Admitting: Vascular Surgery

## 2023-05-13 ENCOUNTER — Encounter: Payer: Self-pay | Admitting: Vascular Surgery

## 2023-05-13 ENCOUNTER — Ambulatory Visit (INDEPENDENT_AMBULATORY_CARE_PROVIDER_SITE_OTHER): Payer: Self-pay | Admitting: Vascular Surgery

## 2023-05-13 VITALS — BP 138/72 | HR 65 | Temp 97.6°F | Wt 166.0 lb

## 2023-05-13 DIAGNOSIS — I739 Peripheral vascular disease, unspecified: Secondary | ICD-10-CM

## 2023-05-13 NOTE — Progress Notes (Signed)
VASCULAR [facility] VEIN SPECIALISTS OF [place] PROGRESS NOTE  ASSESSMENT / PLAN: Ashley [name] is a 67 y.o. female status post iliofemoral endarterectomy and saphenous vein patch angioplasty for ischemic rest pain of the right foot 07/19/21.  She is still rest pain-free.  I will see her again in a year with an ankle-brachial index  SUBJECTIVE: Doing well overall.  No claudication symptoms. No rest pain. No ulcers. Patient walking as much as she likes. No limitations.  OBJECTIVE: BP 138/72 (BP Location: Left Arm, Patient Position: Sitting, Cuff Size: Normal)   Pulse 65   Temp 97.6 F (36.4 C) (Temporal)   Wt 166 lb (75.3 kg)   SpO2 99%   BMI 28.49 kg/m   No acute distress Bilateral feet warm and well-perfused      Latest Ref Rng & Units 01/24/2022   12:00 AM 10/26/2021   12:00 AM 07/20/2021    4:25 AM  CBC  WBC  6.7  7.6     9.2   Hemoglobin 12.0 - 16.0 11.9  10.4     8.0   Hematocrit 36 - 46 36  33     25.2   Platelets 150 - 399 234  289     235      This result is from an external source.        Latest Ref Rng & Units 10/26/2021   12:00 AM 07/20/2021    4:25 AM 07/19/2021    6:02 PM  CMP  Glucose 70 - 99 mg/dL  161    BUN 4 - 21 13     6     Creatinine 0.5 - 1.1 0.5     0.56  0.59   Sodium 137 - 147 140     136    Potassium 3.4 - 5.3 4.6     4.1    Chloride 99 - 108 104     99    CO2 13 - 22 25     25     Calcium 8.7 - 10.7 9.7     9.1    Alkaline Phos 25 - 125 80        AST 13 - 35 58        ALT 7 - 35 32           This result is from an external source.    CrCl cannot be calculated (Patient's most recent lab result is older than the maximum 21 days allowed.).   +-------+-----------+-----------+------------+------------+  ABI/TBIToday's ABIToday's TBIPrevious ABIPrevious TBI  +-------+-----------+-----------+------------+------------+  Right 0.46       0.37       0.59        0.37          +-------+-----------+-----------+------------+------------+   Left  0.44       0.34       0.5         0.36          +-------+-----------+-----------+------------+------------+    [name]. [name], MD [facility] of [facility] Phone Number: [phone] 05/13/2023 3:23 PM

## 2023-05-14 LAB — VAS US ABI WITH/WO TBI
Left ABI: 0.44
Right ABI: 0.46

## 2023-05-24 ENCOUNTER — Other Ambulatory Visit: Payer: Self-pay

## 2023-05-24 DIAGNOSIS — I739 Peripheral vascular disease, unspecified: Secondary | ICD-10-CM

## 2023-10-29 DIAGNOSIS — M47896 Other spondylosis, lumbar region: Secondary | ICD-10-CM | POA: Diagnosis not present

## 2023-10-29 DIAGNOSIS — G894 Chronic pain syndrome: Secondary | ICD-10-CM | POA: Diagnosis not present

## 2023-10-29 DIAGNOSIS — Z79891 Long term (current) use of opiate analgesic: Secondary | ICD-10-CM | POA: Diagnosis not present

## 2023-10-29 DIAGNOSIS — M419 Scoliosis, unspecified: Secondary | ICD-10-CM | POA: Diagnosis not present

## 2023-10-29 DIAGNOSIS — M545 Low back pain, unspecified: Secondary | ICD-10-CM | POA: Diagnosis not present

## 2023-10-29 DIAGNOSIS — Z79899 Other long term (current) drug therapy: Secondary | ICD-10-CM | POA: Diagnosis not present

## 2023-11-07 DIAGNOSIS — I739 Peripheral vascular disease, unspecified: Secondary | ICD-10-CM | POA: Diagnosis not present

## 2023-11-07 DIAGNOSIS — I10 Essential (primary) hypertension: Secondary | ICD-10-CM | POA: Diagnosis not present

## 2023-11-07 DIAGNOSIS — E1151 Type 2 diabetes mellitus with diabetic peripheral angiopathy without gangrene: Secondary | ICD-10-CM | POA: Diagnosis not present

## 2023-11-07 DIAGNOSIS — Z23 Encounter for immunization: Secondary | ICD-10-CM | POA: Diagnosis not present

## 2023-11-07 DIAGNOSIS — E782 Mixed hyperlipidemia: Secondary | ICD-10-CM | POA: Diagnosis not present

## 2024-01-13 DIAGNOSIS — M419 Scoliosis, unspecified: Secondary | ICD-10-CM | POA: Diagnosis not present

## 2024-01-13 DIAGNOSIS — G894 Chronic pain syndrome: Secondary | ICD-10-CM | POA: Diagnosis not present

## 2024-01-13 DIAGNOSIS — M47816 Spondylosis without myelopathy or radiculopathy, lumbar region: Secondary | ICD-10-CM | POA: Diagnosis not present

## 2024-01-21 DIAGNOSIS — Z794 Long term (current) use of insulin: Secondary | ICD-10-CM | POA: Diagnosis not present

## 2024-01-21 DIAGNOSIS — E119 Type 2 diabetes mellitus without complications: Secondary | ICD-10-CM | POA: Diagnosis not present

## 2024-01-21 DIAGNOSIS — Z7984 Long term (current) use of oral hypoglycemic drugs: Secondary | ICD-10-CM | POA: Diagnosis not present

## 2024-01-21 DIAGNOSIS — H524 Presbyopia: Secondary | ICD-10-CM | POA: Diagnosis not present

## 2024-01-21 DIAGNOSIS — H25813 Combined forms of age-related cataract, bilateral: Secondary | ICD-10-CM | POA: Diagnosis not present

## 2024-03-22 ENCOUNTER — Other Ambulatory Visit: Payer: Self-pay

## 2024-03-22 DIAGNOSIS — I739 Peripheral vascular disease, unspecified: Secondary | ICD-10-CM

## 2024-03-25 DIAGNOSIS — E1151 Type 2 diabetes mellitus with diabetic peripheral angiopathy without gangrene: Secondary | ICD-10-CM | POA: Diagnosis not present

## 2024-03-25 DIAGNOSIS — E782 Mixed hyperlipidemia: Secondary | ICD-10-CM | POA: Diagnosis not present

## 2024-03-25 DIAGNOSIS — I10 Essential (primary) hypertension: Secondary | ICD-10-CM | POA: Diagnosis not present

## 2024-03-25 DIAGNOSIS — I739 Peripheral vascular disease, unspecified: Secondary | ICD-10-CM | POA: Diagnosis not present

## 2024-04-14 DIAGNOSIS — M545 Low back pain, unspecified: Secondary | ICD-10-CM | POA: Diagnosis not present

## 2024-04-14 DIAGNOSIS — G894 Chronic pain syndrome: Secondary | ICD-10-CM | POA: Diagnosis not present

## 2024-04-22 DIAGNOSIS — Z1231 Encounter for screening mammogram for malignant neoplasm of breast: Secondary | ICD-10-CM | POA: Diagnosis not present

## 2024-05-13 DIAGNOSIS — L821 Other seborrheic keratosis: Secondary | ICD-10-CM | POA: Diagnosis not present

## 2024-05-13 DIAGNOSIS — D225 Melanocytic nevi of trunk: Secondary | ICD-10-CM | POA: Diagnosis not present

## 2024-05-13 DIAGNOSIS — L814 Other melanin hyperpigmentation: Secondary | ICD-10-CM | POA: Diagnosis not present

## 2024-05-25 ENCOUNTER — Encounter: Payer: Self-pay | Admitting: Oncology

## 2024-05-31 NOTE — Progress Notes (Unsigned)
 VASCULAR AND VEIN SPECIALISTS OF [address]  ASSESSMENT / PLAN: Ashley [name] is a 68 y.o. female status post right iliofemoral endarterectomy and profundaplasty 07/19/21 for rest pain.  Recommend:  Abstinence from all tobacco products. Blood glucose control with goal A1c < 7%. Blood pressure control with goal blood pressure < 130/80 mmHg. Lipid reduction therapy with goal LDL-C < 55 mg/dL. Aspirin  81mg  by mouth daily. Atorvastatin  40-80mg  PO QD (or other high intensity statin therapy).  Plan *** lower extremity angiogram with possible intervention via *** approach in cath lab ***.   CHIEF COMPLAINT: ***  HISTORY OF PRESENT ILLNESS: Ashley [name] is a 68 y.o. female ***  VASCULAR SURGICAL HISTORY: ***  VASCULAR RISK FACTORS: {FINDINGS; POSITIVE NEGATIVE:878 614 1667} history of stroke / transient ischemic attack. {FINDINGS; POSITIVE NEGATIVE:878 614 1667} history of coronary artery disease. *** history of PCI. *** history of CABG.  {FINDINGS; POSITIVE NEGATIVE:878 614 1667} history of diabetes mellitus. Last A1c ***. {FINDINGS; POSITIVE NEGATIVE:878 614 1667} history of smoking. *** actively smoking. {FINDINGS; POSITIVE NEGATIVE:878 614 1667} history of hypertension. *** drug regimen with *** control. {FINDINGS; POSITIVE NEGATIVE:878 614 1667} history of chronic kidney disease.  Last GFR ***. CKD {stage:30421363}. {FINDINGS; POSITIVE NEGATIVE:878 614 1667} history of chronic obstructive pulmonary disease, treated with ***.  FUNCTIONAL STATUS: ECOG performance status: {findings; ecog performance status:31780} Ambulatory status: {TNHAmbulation:25868}  CAREY 1 AND 3 YEAR INDEX Female (2pts) 75-79 or 80-84 (2pts) >84 (3pts) Dependence in toileting (1pt) Partial or full dependence in dressing (1pt) History of malignant neoplasm (2pts) CHF (3pts) COPD (1pts) CKD (3pts)  0-3 pts 6% 1 year mortality ; 21% 3 year mortality 4-5 pts 12% 1 year mortality ; 36% 3 year mortality >5 pts 21% 1 year  mortality; 54% 3 year mortality   Past Medical History:  Diagnosis Date   Anxiety    COVID    Diabetes mellitus without complication (HCC)    Hypertension    Peripheral vascular disease (HCC)     Past Surgical History:  Procedure Laterality Date   APPLICATION OF WOUND VAC Right 07/19/2021   Procedure: APPLICATION OF WOUND VAC;  Surgeon: Magda Debby SAILOR, MD;  Location: [facility] OR;  Service: Vascular;  Laterality: Right;   CHOLECYSTECTOMY     ENDARTERECTOMY FEMORAL Right 07/19/2021   Procedure: RIGHT ILIO-FEMORAL ENDARTERECTOMY;  Surgeon: Magda Debby SAILOR, MD;  Location: [facility] OR;  Service: Vascular;  Laterality: Right;   IR ANGIOGRAM EXTREMITY RIGHT  04/30/2021   IR ILIAC ART STENT INC PTA EA ADD IPSILAT MOD SED  04/30/2021   IR US  GUIDE VASC ACCESS RIGHT  04/30/2021   IR US  GUIDE VASC ACCESS RIGHT  04/30/2021   VEIN HARVEST Right 07/19/2021   Procedure: RIGHT SAPHENOUS VEIN HARVEST;  Surgeon: Magda Debby SAILOR, MD;  Location: [facility] OR;  Service: Vascular;  Laterality: Right;    No family history on file.  Social History   Socioeconomic History   Marital status: Married    Spouse name: Not on file   Number of children: Not on file   Years of education: Not on file   Highest education level: Not on file  Occupational History   Not on file  Tobacco Use   Smoking status: Former    Current packs/day: 1.50    Types: Cigarettes   Smokeless tobacco: Never  Vaping Use   Vaping status: Never Used  Substance and Sexual Activity   Alcohol use: Not Currently   Drug use: Never   Sexual activity: Not on file  Other Topics Concern   Not on file  Social History Narrative  Not on file   Social Drivers of Health   Financial Resource Strain: Not on file  Food Insecurity: Not on file  Transportation Needs: Not on file  Physical Activity: Not on file  Stress: Not on file  Social Connections: Not on file  Intimate Partner Violence: Not on file    Allergies  Allergen Reactions    Codeine     Upset stomach    Current Outpatient Medications  Medication Sig Dispense Refill   acetaminophen  (TYLENOL ) 500 MG tablet Take 1,000 mg by mouth every 6 (six) hours as needed for moderate pain or headache.     acyclovir (ZOVIRAX) 400 MG tablet Take 400 mg by mouth daily as needed (fever blisters).     ALPRAZolam  (XANAX ) 0.5 MG tablet Take 0.5 mg by mouth daily as needed for anxiety.     aspirin  325 MG tablet Take 325 mg by mouth daily.     atorvastatin  (LIPITOR ) 80 MG tablet Take 80 mg by mouth daily.     cyclobenzaprine  (FLEXERIL ) 5 MG tablet Take 5 mg by mouth at bedtime.     glipiZIDE  (GLUCOTROL ) 5 MG tablet Take 10 mg by mouth in the morning and at bedtime.     HUMULIN  N 100 UNIT/ML injection Inject 60 Units into the skin daily.     hydrochlorothiazide  (HYDRODIURIL ) 25 MG tablet Take 12.5 mg by mouth daily.     linaclotide  (LINZESS ) 290 MCG CAPS capsule Take 290 mcg by mouth daily.     losartan  (COZAAR ) 100 MG tablet Take 100 mg by mouth daily.     metFORMIN (GLUCOPHAGE) 1000 MG tablet Take 1,000-1,500 mg by mouth See admin instructions. Take 1000 mg in the morning and 1500 mg at night     metoprolol  tartrate (LOPRESSOR ) 25 MG tablet Take 37.5 mg by mouth 2 (two) times daily.     OVER THE COUNTER MEDICATION Take 2 capsules by mouth daily. Keto otc supplement     pantoprazole  (PROTONIX ) 40 MG tablet Take 40 mg by mouth daily.     potassium chloride  (MICRO-K ) 10 MEQ CR capsule Take 10 mEq by mouth daily.     Tetrahydrozoline HCl (VISINE OP) Place 1 drop into both eyes daily as needed (irritation).     traMADol  (ULTRAM ) 50 MG tablet Take 50 mg by mouth 3 (three) times daily as needed for pain.     TRULICITY 1.5 MG/0.5ML SOPN Inject 1.5 mg into the skin every Wednesday.     No current facility-administered medications for this visit.    PHYSICAL EXAM There were no vitals filed for this visit.  Constitutional: *** appearing. *** distress. Appears *** nourished.  Neurologic:  CN ***. *** focal findings. *** sensory loss. Psychiatric: *** Mood and affect symmetric and appropriate. Eyes: *** No icterus. No conjunctival pallor. Ears, nose, throat: *** mucous membranes moist. Midline trachea.  Cardiac: *** rate and rhythm.  Respiratory: *** unlabored. Abdominal: *** soft, non-tender, non-distended.  Peripheral vascular: *** Extremity: *** edema. *** cyanosis. *** pallor.  Skin: *** gangrene. *** ulceration.  Lymphatic: *** Stemmer's sign. *** palpable lymphadenopathy.    PERTINENT LABORATORY AND RADIOLOGIC DATA  Most recent CBC    Latest Ref Rng & Units 01/24/2022   12:00 AM 10/26/2021   12:00 AM 07/20/2021    4:25 AM  CBC  WBC  6.7  7.6     9.2   Hemoglobin 12.0 - 16.0 11.9  10.4     8.0   Hematocrit 36 - 46 36  33     25.2   Platelets 150 - 399 234  289     235      This result is from an external source.     Most recent CMP    Latest Ref Rng & Units 10/26/2021   12:00 AM 07/20/2021    4:25 AM 07/19/2021    6:02 PM  CMP  Glucose 70 - 99 mg/dL  679    BUN 4 - 21 13     6     Creatinine 0.5 - 1.1 0.5     0.56  0.59   Sodium 137 - 147 140     136    Potassium 3.4 - 5.3 4.6     4.1    Chloride 99 - 108 104     99    CO2 13 - 22 25     25     Calcium  8.7 - 10.7 9.7     9.1    Alkaline Phos 25 - 125 80        AST 13 - 35 58        ALT 7 - 35 32           This result is from an external source.    Renal function CrCl cannot be calculated (Patient's most recent lab result is older than the maximum 21 days allowed.).  Hgb A1c MFr Bld (%)  Date Value  07/19/2021 8.4 (H)    No results found for: LDLCALC, LDLC, HIRISKLDL, POCLDL, LDLDIRECT, REALLDLC, TOTLDLC   Vascular Imaging: ***  Ashley [name] N. Magda, MD FACS [facility]ascular [facility] Vein Special[facility] Phone Number: [phone] 05/31/2024 9:18 AM   Total time spent on preparing this encounter including chart review, data review, collecting history, examining the  patient, and coordinating care: {tnhtimebilling:26202} {billinglist:27273}  Portions of this report may have been transcribed using voice recognition software.  Every effort has been made to ensure accuracy; however, inadvertent computerized transcription errors may still be present.

## 2024-06-01 ENCOUNTER — Ambulatory Visit (HOSPITAL_COMMUNITY)
Admission: RE | Admit: 2024-06-01 | Discharge: 2024-06-01 | Disposition: A | Discharge status: Home / Self Care | Source: Ambulatory Visit | Attending: Vascular Surgery | Admitting: Vascular Surgery

## 2024-06-01 ENCOUNTER — Ambulatory Visit: Attending: Vascular Surgery | Admitting: Vascular Surgery

## 2024-06-01 ENCOUNTER — Encounter: Payer: Self-pay | Admitting: Vascular Surgery

## 2024-06-01 VITALS — BP 147/81 | HR 66 | Temp 97.7°F | Ht 64.0 in | Wt 165.0 lb

## 2024-06-01 DIAGNOSIS — I739 Peripheral vascular disease, unspecified: Secondary | ICD-10-CM | POA: Diagnosis present

## 2024-06-01 LAB — VAS US ABI WITH/WO TBI
Left ABI: 0.47
Right ABI: 0.79

## 2024-06-25 DIAGNOSIS — I10 Essential (primary) hypertension: Secondary | ICD-10-CM | POA: Diagnosis not present

## 2024-06-25 DIAGNOSIS — E782 Mixed hyperlipidemia: Secondary | ICD-10-CM | POA: Diagnosis not present

## 2024-06-25 DIAGNOSIS — E1151 Type 2 diabetes mellitus with diabetic peripheral angiopathy without gangrene: Secondary | ICD-10-CM | POA: Diagnosis not present

## 2024-06-25 DIAGNOSIS — Z794 Long term (current) use of insulin: Secondary | ICD-10-CM | POA: Diagnosis not present

## 2024-06-25 DIAGNOSIS — I739 Peripheral vascular disease, unspecified: Secondary | ICD-10-CM | POA: Diagnosis not present

## 2024-07-28 DIAGNOSIS — R070 Pain in throat: Secondary | ICD-10-CM | POA: Diagnosis not present

## 2024-07-28 DIAGNOSIS — M791 Myalgia, unspecified site: Secondary | ICD-10-CM | POA: Diagnosis not present

## 2024-07-28 DIAGNOSIS — R051 Acute cough: Secondary | ICD-10-CM | POA: Diagnosis not present

## 2024-07-28 DIAGNOSIS — R112 Nausea with vomiting, unspecified: Secondary | ICD-10-CM | POA: Diagnosis not present

## 2024-07-28 DIAGNOSIS — R519 Headache, unspecified: Secondary | ICD-10-CM | POA: Diagnosis not present

## 2024-08-24 DIAGNOSIS — M545 Low back pain, unspecified: Secondary | ICD-10-CM | POA: Diagnosis not present

## 2024-08-24 DIAGNOSIS — G894 Chronic pain syndrome: Secondary | ICD-10-CM | POA: Diagnosis not present

## 2024-08-24 DIAGNOSIS — M4120 Other idiopathic scoliosis, site unspecified: Secondary | ICD-10-CM | POA: Diagnosis not present

## 2024-11-22 NOTE — Progress Notes (Signed)
 Ashley [name]                                          MRN: [id number]   11/22/2024   The [facility] Quality Team Specialist reviewed this patient medical record for the purposes of chart review for care gap closure. The following were reviewed: chart review for care gap closure-glycemic status assessment.    [facility] Quality Team

## 2024-12-17 NOTE — Progress Notes (Signed)
 Ashley [name]                                          MRN: [id number]   12/17/2024   The [facility] Quality Team Specialist reviewed this patient medical record for the purposes of chart review for care gap closure. The following were reviewed: chart review for care gap closure-glycemic status assessment.    [facility] Quality Team
